# Patient Record
Sex: Female | Born: 1960
Health system: Southern US, Community
[De-identification: ages and names within clinical notes are randomized; demographics above are authoritative.]

## PROBLEM LIST (undated history)

## (undated) DIAGNOSIS — N200 Calculus of kidney: Secondary | ICD-10-CM

## (undated) DIAGNOSIS — C649 Malignant neoplasm of unspecified kidney, except renal pelvis: Secondary | ICD-10-CM

## (undated) DIAGNOSIS — Z8489 Family history of other specified conditions: Secondary | ICD-10-CM

## (undated) DIAGNOSIS — N6019 Diffuse cystic mastopathy of unspecified breast: Secondary | ICD-10-CM

## (undated) DIAGNOSIS — B019 Varicella without complication: Secondary | ICD-10-CM

## (undated) DIAGNOSIS — C4490 Unspecified malignant neoplasm of skin, unspecified: Secondary | ICD-10-CM

## (undated) DIAGNOSIS — T7840XA Allergy, unspecified, initial encounter: Secondary | ICD-10-CM

## (undated) DIAGNOSIS — Z87442 Personal history of urinary calculi: Secondary | ICD-10-CM

## (undated) DIAGNOSIS — F32A Depression, unspecified: Secondary | ICD-10-CM

## (undated) HISTORY — PX: TONSILLECTOMY AND ADENOIDECTOMY: SHX28

## (undated) HISTORY — DX: Unspecified malignant neoplasm of skin, unspecified: C44.90

## (undated) HISTORY — DX: Depression, unspecified: F32.A

## (undated) HISTORY — PX: MOHS SURGERY: SUR867

## (undated) HISTORY — DX: Allergy, unspecified, initial encounter: T78.40XA

## (undated) HISTORY — DX: Malignant neoplasm of unspecified kidney, except renal pelvis: C64.9

## (undated) HISTORY — DX: Diffuse cystic mastopathy of unspecified breast: N60.19

## (undated) HISTORY — DX: Varicella without complication: B01.9

## (undated) HISTORY — DX: Calculus of kidney: N20.0

---
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
## 2008-02-06 HISTORY — PX: MOHS SURGERY: SUR867

## 2011-08-08 LAB — HM PAP SMEAR: HM Pap smear: NEGATIVE

## 2013-02-05 HISTORY — PX: COLONOSCOPY: SHX174

## 2013-11-21 LAB — HM PAP SMEAR: HM Pap smear: NEGATIVE

## 2016-02-01 LAB — CBC AND DIFFERENTIAL
HCT: 41 (ref 36–46)
Hemoglobin: 14.3 (ref 12.0–16.0)
Platelets: 290 (ref 150–399)
WBC: 11.3

## 2016-02-01 LAB — BASIC METABOLIC PANEL
BUN: 14 (ref 4–21)
CO2: 26 — AB (ref 13–22)
Chloride: 104 (ref 99–108)
Creatinine: 0.7 (ref 0.5–1.1)
Potassium: 4.4 (ref 3.4–5.3)
Sodium: 140 (ref 137–147)

## 2016-02-01 LAB — HEPATIC FUNCTION PANEL
ALT: 24 (ref 7–35)
AST: 22 (ref 13–35)
Bilirubin, Total: 0.5

## 2016-02-01 LAB — HEMOGLOBIN A1C: Hemoglobin A1C: 6.1

## 2016-02-01 LAB — COMPREHENSIVE METABOLIC PANEL
Albumin: 4.5 (ref 3.5–5.0)
GFR calc Af Amer: 103
GFR calc non Af Amer: 89

## 2016-02-01 LAB — LIPID PANEL
Cholesterol: 221 — AB (ref 0–200)
HDL: 165 — AB (ref 35–70)
LDL Cholesterol: 127
LDl/HDL Ratio: 3.9
Triglycerides: 249 — AB (ref 40–160)

## 2016-02-01 LAB — HM HEPATITIS C SCREENING LAB: HM Hepatitis Screen: NEGATIVE

## 2016-02-01 LAB — CBC: RBC: 4.7 (ref 3.87–5.11)

## 2016-02-01 LAB — TSH: TSH: 1.24 (ref 0.41–5.90)

## 2016-05-04 LAB — HM MAMMOGRAPHY

## 2019-08-19 ENCOUNTER — Encounter: Payer: Self-pay | Admitting: Family Medicine

## 2019-08-20 ENCOUNTER — Encounter: Payer: Self-pay | Admitting: Family Medicine

## 2019-08-25 ENCOUNTER — Ambulatory Visit (INDEPENDENT_AMBULATORY_CARE_PROVIDER_SITE_OTHER): Payer: Commercial Managed Care - PPO | Admitting: Family Medicine

## 2019-08-25 ENCOUNTER — Other Ambulatory Visit: Payer: Self-pay

## 2019-08-25 VITALS — BP 109/71 | HR 74 | Temp 97.5°F | Ht 65.0 in | Wt 184.0 lb

## 2019-08-25 DIAGNOSIS — R7303 Prediabetes: Secondary | ICD-10-CM | POA: Insufficient documentation

## 2019-08-25 DIAGNOSIS — E669 Obesity, unspecified: Secondary | ICD-10-CM | POA: Diagnosis not present

## 2019-08-25 DIAGNOSIS — Z7689 Persons encountering health services in other specified circumstances: Secondary | ICD-10-CM

## 2019-08-25 DIAGNOSIS — Z23 Encounter for immunization: Secondary | ICD-10-CM

## 2019-08-25 DIAGNOSIS — Z1231 Encounter for screening mammogram for malignant neoplasm of breast: Secondary | ICD-10-CM

## 2019-08-25 DIAGNOSIS — Z130 Encounter for screening for diseases of the blood and blood-forming organs and certain disorders involving the immune mechanism: Secondary | ICD-10-CM | POA: Diagnosis not present

## 2019-08-25 DIAGNOSIS — Z0000 Encounter for general adult medical examination without abnormal findings: Secondary | ICD-10-CM | POA: Insufficient documentation

## 2019-08-25 DIAGNOSIS — Z85828 Personal history of other malignant neoplasm of skin: Secondary | ICD-10-CM | POA: Diagnosis not present

## 2019-08-25 DIAGNOSIS — D72829 Elevated white blood cell count, unspecified: Secondary | ICD-10-CM

## 2019-08-25 DIAGNOSIS — Z8481 Family history of carrier of genetic disease: Secondary | ICD-10-CM

## 2019-08-25 LAB — CBC WITH DIFFERENTIAL/PLATELET
Basophils Absolute: 0.1 10*3/uL (ref 0.0–0.1)
Basophils Relative: 0.6 % (ref 0.0–3.0)
Eosinophils Absolute: 0.2 10*3/uL (ref 0.0–0.7)
Eosinophils Relative: 2.1 % (ref 0.0–5.0)
HCT: 41.2 % (ref 36.0–46.0)
Hemoglobin: 14.2 g/dL (ref 12.0–15.0)
Lymphocytes Relative: 44.9 % (ref 12.0–46.0)
Lymphs Abs: 4.8 10*3/uL — ABNORMAL HIGH (ref 0.7–4.0)
MCHC: 34.4 g/dL (ref 30.0–36.0)
MCV: 88.1 fl (ref 78.0–100.0)
Monocytes Absolute: 0.5 10*3/uL (ref 0.1–1.0)
Monocytes Relative: 5 % (ref 3.0–12.0)
Neutro Abs: 5.1 10*3/uL (ref 1.4–7.7)
Neutrophils Relative %: 47.4 % (ref 43.0–77.0)
Platelets: 280 10*3/uL (ref 150.0–400.0)
RBC: 4.68 Mil/uL (ref 3.87–5.11)
RDW: 13.8 % (ref 11.5–15.5)
WBC: 10.7 10*3/uL — ABNORMAL HIGH (ref 4.0–10.5)

## 2019-08-25 LAB — LIPID PANEL
Cholesterol: 194 mg/dL (ref 0–200)
HDL: 57.7 mg/dL (ref >39.00)
LDL Cholesterol: 105 mg/dL — ABNORMAL HIGH (ref 0–99)
NonHDL: 136.31
Total CHOL/HDL Ratio: 3
Triglycerides: 159 mg/dL — ABNORMAL HIGH (ref 0.0–149.0)
VLDL: 31.8 mg/dL (ref 0.0–40.0)

## 2019-08-25 LAB — COMPREHENSIVE METABOLIC PANEL
ALT: 21 U/L (ref 0–35)
AST: 21 U/L (ref 0–37)
Albumin: 4.4 g/dL (ref 3.5–5.2)
Alkaline Phosphatase: 70 U/L (ref 39–117)
BUN: 20 mg/dL (ref 6–23)
CO2: 27 mEq/L (ref 19–32)
Calcium: 9.6 mg/dL (ref 8.4–10.5)
Chloride: 103 mEq/L (ref 96–112)
Creatinine, Ser: 0.75 mg/dL (ref 0.40–1.20)
GFR: 79.78 mL/min (ref >60.00)
Glucose, Bld: 100 mg/dL — ABNORMAL HIGH (ref 70–99)
Potassium: 4.2 mEq/L (ref 3.5–5.1)
Sodium: 139 mEq/L (ref 135–145)
Total Bilirubin: 0.6 mg/dL (ref 0.2–1.2)
Total Protein: 7 g/dL (ref 6.0–8.3)

## 2019-08-25 LAB — HEMOGLOBIN A1C: Hgb A1c MFr Bld: 6.2 % (ref 4.6–6.5)

## 2019-08-25 LAB — TSH: TSH: 1.21 u[IU]/mL (ref 0.35–4.50)

## 2019-08-25 NOTE — Progress Notes (Signed)
Patient ID: Angela Nielsen, female  DOB: 1960-05-28, 59 y.o.   MRN: [id number] Patient Care Team    Relationship Specialty Notifications Start End  Ma Hillock, DO PCP - General Family Medicine  08/25/19     Chief Complaint  Patient presents with  . Establish Care    fasting for labs   . Referral    to dermatology   . Back Pain    lower right side has "ache from time to time"     Subjective:  Angela Nielsen is a 59 y.o.  female present for new patient establishment. All past medical history, surgical history, allergies, family history, immunizations, medications and social history were updated in the electronic medical record today. All recent labs, ED visits and hospitalizations within the last year were reviewed.  Health maintenance:  [place]: [place] 2015, [place]. follow up 10 yrs.  Mammogram: completed:2018> ordered today. Breast cancer in Mother (Mother BRCA +).  Cervical cancer screening: last pap: 2015 or 2017, results: always normal. Immunizations: tdap UTD 11/2013, Influenza UTD (encouraged yearly), PNA series 2016, shingrix  #1 provided today (nurse visit 3 mos for #2), COVID series completed May 2021 Infectious disease screening: HIV completed 1995, Hep C completed.  DEXA: routine screen at 62 Assistive device: none Oxygen IEP:PIRJ Patient has a Dental home. Hospitalizations/ED visits:reviewed  Depression screen [facility] 2/9 08/25/2019  Decreased Interest 0  Down, Depressed, Hopeless 0  PHQ - 2 Score 0  Altered sleeping 1  Tired, decreased energy 0  Change in appetite 0  Feeling bad or failure about yourself  0  Trouble concentrating 0  Moving slowly or fidgety/restless 0  Suicidal thoughts 0  PHQ-9 Score 1  Difficult doing work/chores Not difficult at all   No flowsheet data found.     Fall Risk  08/25/2019  Falls in the past year? 0  Number falls in past yr: 0  Injury with Fall? 0   Immunization History  Administered Date(s) Administered  .  Influenza, Seasonal, Injecte, Preservative Fre 11/05/2017  . PFIZER SARS-COV-2 Vaccination 05/07/2019, 06/08/2019  . Pneumococcal Conjugate-13 02/05/2014  . Tdap 11/20/2013    No exam data present  Past Medical History:  Diagnosis Date  . Allergies   . Chicken pox   . Depression   . Fibrocystic breast disease   . Kidney stone   . Skin cancer    Basal cell    Not on File Past Surgical History:  Procedure Laterality Date  . CESAREAN SECTION  1995  . COLONOSCOPY  2015  . MOHS SURGERY     nose and shoulder   . TONSILLECTOMY AND ADENOIDECTOMY     Family History  Problem Relation Age of Onset  . Breast cancer Mother   . Heart disease Mother   . Stroke Mother   . Gout Father   . COPD Father   . Hyperlipidemia Father   . Hypertension Brother   . Hyperlipidemia Brother   . Hypertension Brother    Social History   Social History Narrative  . Not on file    Allergies as of 08/25/2019   Not on File     Medication List       Accurate as of August 25, 2019 10:48 AM. If you have any questions, ask your nurse or doctor.        fluorouracil 5 % cream Commonly known as: EFUDEX Apply topically 2 (two) times daily.   hydrocortisone 2.5 % cream Apply topically 2 (  two) times daily.   OVER THE COUNTER MEDICATION Tespo multi vitamin daily       All past medical history, surgical history, allergies, family history, immunizations andmedications were updated in the EMR today and reviewed under the history and medication portions of their EMR.    No results found for this or any previous visit (from the past 2160 hour(s)).  Patient was never admitted.   ROS: 14 pt review of systems performed and negative (unless mentioned in an HPI)  Objective: BP 109/71   Pulse 74   Temp (!) 97.5 F (36.4 C) (Temporal)   Ht '5\' 5"'  (1.651 m)   Wt 184 lb (83.5 kg)   LMP 08/24/2013   SpO2 98%   BMI 30.62 kg/m  Gen: Afebrile. No acute distress. Nontoxic in appearance,  well-developed, well-nourished, very pleasant, obese Caucasian female. HENT: AT. [place]. Bilateral TM visualized and normal in appearance, normal external auditory canal. MMM, no oral lesions, adequate dentition. Bilateral nares within normal limits. Throat without erythema, ulcerations or exudates. no Cough on exam, no hoarseness on exam. Eyes:Pupils Equal Round Reactive to light, Extraocular movements intact,  Conjunctiva without redness, discharge or icterus. Neck/lymp/endocrine: Supple,no lymphadenopathy, no thyromegaly CV: RRR no murmur, no edema, +2/4 P posterior tibialis pulses.  Chest: CTAB, no wheeze, rhonchi or crackles. normal Respiratory effort. good Air movement. Abd: Soft. obese. NTND. BS present. no Masses palpated. No hepatosplenomegaly. No rebound tenderness or guarding. Skin: no rashes, purpura or petechiae. Warm and well-perfused. Skin intact. Neuro/Msk:  Normal gait. PERLA. EOMi. Alert. Oriented x3.  Cranial nerves II through XII intact. Muscle strength 5/5 upper/lower extremity. DTRs equal bilaterally. Psych: Normal affect, dress and demeanor. Normal speech. Normal thought content and judgment.   Assessment/plan: Angela Nielsen is a 59 y.o. female present for new pt est History of basal cell cancer - Ambulatory referral to Dermatology Breast cancer screening by mammogram/Family history of BRCA gene mutation - MM DIGITAL SCREENING BILATERAL; Future Prediabetes Patient had been on Metformin in the past-  has been able to control with diet. - Comprehensive metabolic panel - Hemoglobin A1c - TSH Obesity (BMI 30-39.9 -Pain exercise and dietary modifications encouraged - Lipid panel Screening for deficiency anemia - CBC with Differential/Platelet  Leukocytosis, unspecified type Chronic lymphocytosis.  Followed with oncology at one time and was told it is her baseline and normal.  Encounter for preventive health examination Patient was encouraged to exercise greater than 150  minutes a week. Patient was encouraged to choose a diet filled with fresh fruits and vegetables, and lean meats. AVS provided to patient today for education/recommendation on gender specific health and safety maintenance. [place]: [place] follow up 10 yrs.  Mammogram: completed:2018> ordered today. Breast cancer in Mother (Mother BRCA +).  Cervical cancer screening: last pap: 2015 or 2017, results: always normal. Immunizations: tdap UTD 11/2013, Influenza UTD (encouraged yearly), PNA series 2016, shingrix  #1 provided today (nurse visit 3 mos for #2), COVID series completed May 2021 Infectious disease screening: HIV completed 1995, Hep C completed.  DEXA: routine screen at 62  Return in about 1 year (around 08/24/2020) for CPE (30 min); 3 mos nurse visit for shingrix. . Or can have second Shingrix completed with Pap/cervical cancer screening appointment if scheduled in less than 6 months.  Orders Placed This Encounter  Procedures  . MM DIGITAL SCREENING BILATERAL  . CBC with Differential/Platelet  . Comprehensive metabolic panel  . Lipid panel  . Hemoglobin A1c  . TSH  .  Ambulatory referral to Dermatology   No orders of the defined types were placed in this encounter.   Referral Orders     Ambulatory referral to Dermatology   Note is dictated utilizing voice recognition software. Although note has been proof read prior to signing, occasional typographical errors still can be missed. If any questions arise, please do not hesitate to call for verification.  Electronically signed by: Howard Pouch, DO [place]

## 2019-08-25 NOTE — Patient Instructions (Addendum)
Follow up in 1 year for physical. Sooner if needed or kidney stone concerns.  We will call you with lab results.  Mammogram will call  You to schedule.  Dermatology will call to get you established.   Health Maintenance, Female Adopting a healthy lifestyle and getting preventive care are important in promoting health and wellness. Ask your health care provider about:  The right schedule for you to have regular tests and exams.  Things you can do on your own to prevent diseases and keep yourself healthy. What should I know about diet, weight, and exercise? Eat a healthy diet   Eat a diet that includes plenty of vegetables, fruits, low-fat dairy products, and lean protein.  Do not eat a lot of foods that are high in solid fats, added sugars, or sodium. Maintain a healthy weight Body mass index (BMI) is used to identify weight problems. It estimates body fat based on height and weight. Your health care provider can help determine your BMI and help you achieve or maintain a healthy weight. Get regular exercise Get regular exercise. This is one of the most important things you can do for your health. Most adults should:  Exercise for at least 150 minutes each week. The exercise should increase your heart rate and make you sweat (moderate-intensity exercise).  Do strengthening exercises at least twice a week. This is in addition to the moderate-intensity exercise.  Spend less time sitting. Even light physical activity can be beneficial. Watch cholesterol and blood lipids Have your blood tested for lipids and cholesterol at 57 years of age, then have this test every 5 years. Have your cholesterol levels checked more often if:  Your lipid or cholesterol levels are high.  You are older than 57 years of age.  You are at high risk for heart disease. What should I know about cancer screening? Depending on your health history and family history, you may need to have cancer screening at  various ages. This may include screening for:  Breast cancer.  Cervical cancer.  Colorectal cancer.  Skin cancer.  Lung cancer. What should I know about heart disease, diabetes, and high blood pressure? Blood pressure and heart disease  High blood pressure causes heart disease and increases the risk of stroke. This is more likely to develop in people who have high blood pressure readings, are of African descent, or are overweight.  Have your blood pressure checked: ? Every 3-5 years if you are 40-14 years of age. ? Every year if you are 23 years old or older. Diabetes Have regular diabetes screenings. This checks your fasting blood sugar level. Have the screening done:  Once every three years after age 73 if you are at a normal weight and have a low risk for diabetes.  More often and at a younger age if you are overweight or have a high risk for diabetes. What should I know about preventing infection? Hepatitis B If you have a higher risk for hepatitis B, you should be screened for this virus. Talk with your health care provider to find out if you are at risk for hepatitis B infection. Hepatitis C Testing is recommended for:  Everyone born from [birth year] through 1963.  Anyone with known risk factors for hepatitis C. Sexually transmitted infections (STIs)  Get screened for STIs, including gonorrhea and chlamydia, if: ? You are sexually active and are younger than 57 years of age. ? You are older than 57 years of age and your health care  provider tells you that you are at risk for this type of infection. ? Your sexual activity has changed since you were last screened, and you are at increased risk for chlamydia or gonorrhea. Ask your health care provider if you are at risk.  Ask your health care provider about whether you are at high risk for HIV. Your health care provider may recommend a prescription medicine to help prevent HIV infection. If you choose to take medicine to prevent  HIV, you should first get tested for HIV. You should then be tested every 3 months for as long as you are taking the medicine. Pregnancy  If you are about to stop having your period (premenopausal) and you may become pregnant, seek counseling before you get pregnant.  Take 400 to 800 micrograms (mcg) of folic acid every day if you become pregnant.  Ask for birth control (contraception) if you want to prevent pregnancy. Osteoporosis and menopause Osteoporosis is a disease in which the bones lose minerals and strength with aging. This can result in bone fractures. If you are 76 years old or older, or if you are at risk for osteoporosis and fractures, ask your health care provider if you should:  Be screened for bone loss.  Take a calcium or vitamin D supplement to lower your risk of fractures.  Be given hormone replacement therapy (HRT) to treat symptoms of menopause. Follow these instructions at home: Lifestyle  Do not use any products that contain nicotine or tobacco, such as cigarettes, e-cigarettes, and chewing tobacco. If you need help quitting, ask your health care provider.  Do not use street drugs.  Do not share needles.  Ask your health care provider for help if you need support or information about quitting drugs. Alcohol use  Do not drink alcohol if: ? Your health care provider tells you not to drink. ? You are pregnant, may be pregnant, or are planning to become pregnant.  If you drink alcohol: ? Limit how much you use to 0-1 drink a day. ? Limit intake if you are breastfeeding.  Be aware of how much alcohol is in your drink. In the [place] one drink equals one 12 oz bottle of beer (355 mL), one 5 oz glass of wine (148 mL), or one 1 oz glass of hard liquor (44 mL). General instructions  Schedule regular health, dental, and eye exams.  Stay current with your vaccines.  Tell your health care provider if: ? You often feel depressed. ? You have ever been abused or do  not feel safe at home. Summary  Adopting a healthy lifestyle and getting preventive care are important in promoting health and wellness.  Follow your health care provider's instructions about healthy diet, exercising, and getting tested or screened for diseases.  Follow your health care provider's instructions on monitoring your cholesterol and blood pressure. This information is not intended to replace advice given to you by your health care provider. Make sure you discuss any questions you have with your health care provider. Document Revised: 01/15/2018 Document Reviewed: 01/15/2018 Elsevier Patient Education  [place].   Please help [place] help you:  We are honored you have chosen [facility] for your Primary Care home. Below you will find basic instructions that you may need to access in the future. Please help [place] help you by reading the instructions, which cover many of the frequent questions we experience.   Prescription refills and request:  -In order to allow more efficient response  time, please call your pharmacy for all refills. They will forward the request electronically to [place]. This allows for the quickest possible response. Request left on a nurse line can take longer to refill, since these are checked as time allows between office patients and other phone calls.  - refill request can take up to 3-5 working days to complete.  - If request is sent electronically and request is appropiate, it is usually completed in 1-2 business days.  - all patients will need to be seen routinely for all chronic medical conditions requiring prescription medications (see follow-up below). If you are overdue for follow up on your condition, you will be asked to make an appointment and we will call in enough medication to cover you until your appointment (up to 30 days).  - all controlled substances will require a face to face visit to request/refill.  - if you desire your prescriptions to  go through a new pharmacy, and have an active script at original pharmacy, you will need to call your pharmacy and have scripts transferred to new pharmacy. This is completed between the pharmacy locations and not by your provider.    Results: Our office handles many outgoing and incoming calls daily. If we have not contacted you within 1 week about your results, please check your mychart to see if there is a message first and if not, then contact our office.  In helping with this matter, you help decrease call volume, and therefore allow [place] to be able to respond to patients needs more efficiently.  We will always attempt to call you with results,  normal or abnormal. However, if we are unable to reach you we will send a message in your my chart with results.   Acute office visits (sick visit):  An acute visit is intended for a new problem and are scheduled in shorter time slots to allow schedule openings for patients with new problems. This is the appropriate visit to discuss a new problem. Problems will not be addressed by phone call or Echart message. Appointment is needed if requesting treatment. In order to provide you with excellent quality medical care with proper time for you to explain your problem, have an exam and receive treatment with instructions, these appointments should be limited to one new problem per visit. If you experience a new problem, in which you desire to be addressed, please make an acute office visit, we save openings on the schedule to accommodate you. Please do not save your new problem for any other type of visit, let us take care of it properly and quickly for you.   Follow up visits:  Depending on your condition(s) your provider will need to see you routinely in order to provide you with quality care and prescribe medication(s). Most chronic conditions (Example: hypertension, Diabetes, depression/anxiety... etc), require visits a couple times a year. Your provider will  instruct you on proper follow up for your personal medical conditions and history. Please make certain to make follow up appointments for your condition as instructed. Failing to do so could result in lapse in your medication treatment/refills. If you request a refill, and are overdue to be seen on a condition, we will always provide you with a 30 day script (once) to allow you time to schedule.    Medicare wellness (well visit): - we have a wonderful Nurse Maudie Mercury), that will meet with you and provide you will yearly medicare wellness visits. These visits should occur yearly (can  not be scheduled less than 1 calendar year apart) and cover preventive health, immunizations, advance directives and screenings you are entitled to yearly through your medicare benefits. Do not miss out on your entitled benefits, this is when medicare will pay for these benefits to be ordered for you.  These are strongly encouraged by your provider and is the appropriate type of visit to make certain you are up to date with all preventive health benefits. If you have not had your medicare wellness exam in the last 12 months, please make certain to schedule one by calling the office and schedule your medicare wellness with Maudie Mercury as soon as possible.   Yearly physical (well visit):  - Adults are recommended to be seen yearly for physicals. Check with your insurance and date of your last physical, most insurances require one calendar year between physicals. Physicals include all preventive health topics, screenings, medical exam and labs that are appropriate for gender/age and history. You may have fasting labs needed at this visit. This is a well visit (not a sick visit), new problems should not be covered during this visit (see acute visit).  - Pediatric patients are seen more frequently when they are younger. Your provider will advise you on well child visit timing that is appropriate for your their age. - This is not a medicare  wellness visit. Medicare wellness exams do not have an exam portion to the visit. Some medicare companies allow for a physical, some do not allow a yearly physical. If your medicare allows a yearly physical you can schedule the medicare wellness with our nurse Maudie Mercury and have your physical with your provider after, on the same day. Please check with insurance for your full benefits.   Late Policy/No Shows:  - all new patients should arrive 15-30 minutes earlier than appointment to allow [place] time  to  obtain all personal demographics,  insurance information and for you to complete office paperwork. - All established patients should arrive 10-15 minutes earlier than appointment time to update all information and be checked in .  - In our best efforts to run on time, if you are late for your appointment you will be asked to either reschedule or if able, we will work you back into the schedule. There will be a wait time to work you back in the schedule,  depending on availability.  - If you are unable to make it to your appointment as scheduled, please call 24 hours ahead of time to allow [place] to fill the time slot with someone else who needs to be seen. If you do not cancel your appointment ahead of time, you may be charged a no show fee.  Your BMI Body mass index is 30.62 kg/m.  BMI for Adults What is BMI? Body mass index (BMI) is a number that is calculated from a person's weight and height. BMI can help estimate how much of a person's weight is composed of fat. BMI does not measure body fat directly. Rather, it is an alternative to procedures that directly measure body fat, which can be difficult and expensive. BMI can help identify people who may be at higher risk for certain medical problems. What are BMI measurements used for? BMI is used as a screening tool to identify possible weight problems. It helps determine whether a person is obese, overweight, a healthy weight, or underweight. BMI is useful  for:  Identifying a weight problem that may be related to a medical condition or may  increase the risk for medical problems.  Promoting changes, such as changes in diet and exercise, to help reach a healthy weight. BMI screening can be repeated to see if these changes are working. How is BMI calculated? BMI involves measuring your weight in relation to your height. Both height and weight are measured, and the BMI is calculated from those numbers. This can be done either in [place] ([place] or metric measurements. Note that charts and online BMI calculators are available to help you find your BMI quickly and easily without having to do these calculations yourself. To calculate your BMI in [place] ([place] measurements:  1. Measure your weight in pounds (lb). 2. Multiply the number of pounds by 703. ? For example, for a person who weighs 180 lb, multiply that number by 703, which equals 126,540. 3. Measure your height in inches. Then multiply that number by itself to get a measurement called "inches squared." ? For example, for a person who is 70 inches tall, the "inches squared" measurement is 70 inches x 70 inches, which equals 4,900 inches squared. 4. Divide the total from step 2 (number of lb x 703) by the total from step 3 (inches squared): 126,540  4,900 = 25.8. This is your BMI. To calculate your BMI in metric measurements: 1. Measure your weight in kilograms (kg). 2. Measure your height in meters (m). Then multiply that number by itself to get a measurement called "meters squared." ? For example, for a person who is 1.75 m tall, the "meters squared" measurement is 1.75 m x 1.75 m, which is equal to 3.1 meters squared. 3. Divide the number of kilograms (your weight) by the meters squared number. In this example: 70  3.1 = 22.6. This is your BMI. What do the results mean? BMI charts are used to identify whether you are underweight, normal weight, overweight, or obese. The following guidelines  will be used:  Underweight: BMI less than 18.5.  Normal weight: BMI between 18.5 and 24.9.  Overweight: BMI between 25 and 29.9.  Obese: BMI of 30 or above. Keep these notes in mind:  Weight includes both fat and muscle, so someone with a muscular build, such as an athlete, may have a BMI that is higher than 24.9. In cases like these, BMI is not an accurate measure of body fat.  To determine if excess body fat is the cause of a BMI of 25 or higher, further assessments may need to be done by a health care provider.  BMI is usually interpreted in the same way for men and women. Where to find more information For more information about BMI, including tools to quickly calculate your BMI, go to these websites:  Centers for Disease Control and Prevention: http://www.wolf.info/  American Heart Association: www.heart.org  National Heart, Lung, and Blood Institute: https://wilson-eaton.com/ Summary  Body mass index (BMI) is a number that is calculated from a person's weight and height.  BMI may help estimate how much of a person's weight is composed of fat. BMI can help identify those who may be at higher risk for certain medical problems.  BMI can be measured using English measurements or metric measurements.  BMI charts are used to identify whether you are underweight, normal weight, overweight, or obese. This information is not intended to replace advice given to you by your health care provider. Make sure you discuss any questions you have with your health care provider. Document Revised: 10/15/2018 Document Reviewed: 08/22/2018 Elsevier Patient Education  2020 Elsevier  Inc.  

## 2019-08-26 ENCOUNTER — Telehealth: Payer: Self-pay

## 2019-08-26 NOTE — Telephone Encounter (Signed)
New patient to Dr. Raoul Pitch yesterday. Signed up on mychart   She received her Shringrix vaccine yesterday but it is not showing up in her immunizations.  Please advise. Patient can be called at [phone]

## 2019-08-27 ENCOUNTER — Encounter: Payer: Self-pay | Admitting: Family Medicine

## 2019-08-27 DIAGNOSIS — N200 Calculus of kidney: Secondary | ICD-10-CM | POA: Insufficient documentation

## 2019-08-27 DIAGNOSIS — D72829 Elevated white blood cell count, unspecified: Secondary | ICD-10-CM | POA: Insufficient documentation

## 2019-08-27 DIAGNOSIS — Z8481 Family history of carrier of genetic disease: Secondary | ICD-10-CM | POA: Insufficient documentation

## 2019-09-08 ENCOUNTER — Other Ambulatory Visit: Payer: Self-pay | Admitting: Family Medicine

## 2019-09-08 DIAGNOSIS — Z1231 Encounter for screening mammogram for malignant neoplasm of breast: Secondary | ICD-10-CM

## 2019-09-09 ENCOUNTER — Ambulatory Visit (INDEPENDENT_AMBULATORY_CARE_PROVIDER_SITE_OTHER): Payer: Commercial Managed Care - PPO

## 2019-09-09 ENCOUNTER — Other Ambulatory Visit: Payer: Self-pay

## 2019-09-09 DIAGNOSIS — Z1231 Encounter for screening mammogram for malignant neoplasm of breast: Secondary | ICD-10-CM | POA: Diagnosis not present

## 2019-11-25 ENCOUNTER — Other Ambulatory Visit: Payer: Self-pay

## 2019-11-25 ENCOUNTER — Encounter: Payer: Self-pay | Admitting: Family Medicine

## 2019-11-25 ENCOUNTER — Ambulatory Visit (INDEPENDENT_AMBULATORY_CARE_PROVIDER_SITE_OTHER): Payer: Commercial Managed Care - PPO

## 2019-11-25 DIAGNOSIS — Z23 Encounter for immunization: Secondary | ICD-10-CM | POA: Diagnosis not present

## 2020-01-18 ENCOUNTER — Other Ambulatory Visit: Payer: Self-pay

## 2020-01-19 ENCOUNTER — Other Ambulatory Visit (HOSPITAL_COMMUNITY)
Admission: RE | Admit: 2020-01-19 | Discharge: 2020-01-19 | Disposition: A | Discharge status: Home / Self Care | Payer: Commercial Managed Care - PPO | Source: Ambulatory Visit | Attending: Family Medicine | Admitting: Family Medicine

## 2020-01-19 ENCOUNTER — Encounter: Payer: Self-pay | Admitting: Family Medicine

## 2020-01-19 ENCOUNTER — Ambulatory Visit (INDEPENDENT_AMBULATORY_CARE_PROVIDER_SITE_OTHER): Payer: Commercial Managed Care - PPO | Admitting: Family Medicine

## 2020-01-19 VITALS — BP 124/79 | HR 77 | Temp 98.1°F | Ht 65.0 in | Wt 198.0 lb

## 2020-01-19 DIAGNOSIS — Z01419 Encounter for gynecological examination (general) (routine) without abnormal findings: Secondary | ICD-10-CM | POA: Diagnosis not present

## 2020-01-19 DIAGNOSIS — R7303 Prediabetes: Secondary | ICD-10-CM

## 2020-01-19 DIAGNOSIS — E669 Obesity, unspecified: Secondary | ICD-10-CM | POA: Diagnosis not present

## 2020-01-19 LAB — HEMOGLOBIN A1C: Hgb A1c MFr Bld: 6.5 % (ref 4.6–6.5)

## 2020-01-19 NOTE — Patient Instructions (Signed)
    We will call you lab results once received, pap test could take up to 1 week.

## 2020-01-19 NOTE — Progress Notes (Signed)
Patient ID: Angela Nielsen, female  DOB: 1960/04/21, 59 y.o.   MRN: [id number] Patient Care Team    Relationship Specialty Notifications Start End  Ma Hillock, DO PCP - General Family Medicine  08/25/19     Chief Complaint  Patient presents with  . Follow-up    CMC; pt is fasting  . Gynecologic Exam    Subjective: Angela Nielsen is a 59 y.o.  female present for [facility] and PAP Prediabetes: pt has been on metformin in the past. Last a1c 6.2 in July 2021. She reports she has not been very good on her diet lately.   Pt reports Patient's last menstrual period was 08/24/2013. She denies vaginal discharge, lesions, bleeding or dysparunia. Last PAP 2015 - normal.    Depression screen [facility] 2/9 01/19/2020 08/25/2019  Decreased Interest 0 0  Down, Depressed, Hopeless 0 0  PHQ - 2 Score 0 0  Altered sleeping - 1  Tired, decreased energy - 0  Change in appetite - 0  Feeling bad or failure about yourself  - 0  Trouble concentrating - 0  Moving slowly or fidgety/restless - 0  Suicidal thoughts - 0  PHQ-9 Score - 1  Difficult doing work/chores - Not difficult at all   No flowsheet data found.     Fall Risk  08/25/2019  Falls in the past year? 0  Number falls in past yr: 0  Injury with Fall? 0   Immunization History  Administered Date(s) Administered  . Influenza, Seasonal, Injecte, Preservative Fre 11/05/2017  . Influenza,inj,Quad PF,6+ Mos 11/25/2019  . PFIZER SARS-COV-2 Vaccination 05/07/2019, 06/08/2019  . Pneumococcal Conjugate-13 02/05/2014  . Tdap 11/20/2013  . Zoster Recombinat (Shingrix) 08/25/2019, 11/25/2019    No exam data present  Past Medical History:  Diagnosis Date  . Allergies   . Chicken pox   . Depression   . Fibrocystic breast disease   . Kidney stone   . Skin cancer    Basal cell    No Known Allergies Past Surgical History:  Procedure Laterality Date  . CESAREAN SECTION  1995  . COLONOSCOPY  2015  . MOHS SURGERY     nose and shoulder   .  TONSILLECTOMY AND ADENOIDECTOMY  1969   Family History  Problem Relation Age of Onset  . Breast cancer Mother        BRCA +  . Heart disease Mother   . Stroke Mother   . Gout Father   . COPD Father   . Hyperlipidemia Father   . Hypertension Brother   . Hyperlipidemia Brother   . Hypertension Brother    Social History   Social History Narrative  . Not on file    Allergies as of 01/19/2020   No Known Allergies     Medication List       Accurate as of January 19, 2020 10:25 AM. If you have any questions, ask your nurse or doctor.        STOP taking these medications   hydrocortisone 2.5 % cream Stopped by: Howard Pouch, DO     TAKE these medications   fluorouracil 5 % cream Commonly known as: EFUDEX Apply topically 2 (two) times daily.   OVER THE COUNTER MEDICATION Tespo multi vitamin daily       All past medical history, surgical history, allergies, family history, immunizations andmedications were updated in the EMR today and reviewed under the history and medication portions of their EMR.    No results  found for this or any previous visit (from the past 2160 hour(s)).  Patient was never admitted.   ROS: 14 pt review of systems performed and negative (unless mentioned in an HPI)  Objective: BP 124/79   Pulse 77   Temp 98.1 F (36.7 C) (Oral)   Ht '5\' 5"'  (1.651 m)   Wt 198 lb (89.8 kg)   LMP 08/24/2013   SpO2 96%   BMI 32.95 kg/m  Gen: Afebrile. No acute distress.  HENT: AT. [place].  Eyes:Pupils Equal Round Reactive to light, Extraocular movements intact,  Conjunctiva without redness, discharge or icterus. Neck/lymp/endocrine: Supple,no lymphadenopathy CV: RRR no murmur, noedema Chest: CTAB, no wheeze or crackles Abd: Soft. NTND. BS present. Skin: no rashes, purpura or petechiae.  Neuro:  Normal gait. PERLA. EOMi. Alert. Orientedx3 Psych: Normal affect, dress and demeanor. Normal speech. Normal thought content and judgment. Breasts: breasts  appear normal, symmetrical, no tenderness on exam, no suspicious masses, no skin or nipple changes or axillary nodes. GYN:  External genitalia within normal limits, normal hair distribution, no lesions. Urethral meatus normal, no lesions. Vaginal mucosa pink, moist, normal rugae, no lesions. No cystocele or rectocele. cervix without lesions-mildly stenotic, mild wht discharge. Bimanual exam revealed normal uterus.  No bladder/suprapubic fullness, masses or tenderness. No cervical motion tenderness. No adnexal fullness. Anus and perineum within normal limits, no lesions.   Assessment/plan: Angela Nielsen is a 59 y.o. female present for  Prediabetes/obesity Patient had been on Metformin in the past-  has been able to control with diet. - HgB A1C- pt declined POCT and desired blood draw.  If > 6.5 would restart metformin. Otherwise, continue to work on diet and exercise.  Encounter for routine gynecological examination with Papanicolaou smear of cervix - Cytology - PAP( [facility]) w/ co-test.  If normal rpt 5 yrs.    Return in about 7 months (around 08/25/2020) for CPE (30 min).   Orders Placed This Encounter  Procedures  . Hemoglobin A1c   No orders of the defined types were placed in this encounter.  Referral Orders  No referral(s) requested today     Note is dictated utilizing voice recognition software. Although note has been proof read prior to signing, occasional typographical errors still can be missed. If any questions arise, please do not hesitate to call for verification.  Electronically signed by: Howard Pouch, DO [facility]

## 2020-01-20 ENCOUNTER — Telehealth: Payer: Self-pay | Admitting: Family Medicine

## 2020-01-20 ENCOUNTER — Other Ambulatory Visit: Payer: Self-pay | Admitting: Family Medicine

## 2020-01-20 MED ORDER — METFORMIN HCL 500 MG PO TABS: 500.0000 mg | ORAL_TABLET | Freq: Every day | ORAL | 1 refills | Status: DC

## 2020-01-20 NOTE — Telephone Encounter (Signed)
Error

## 2020-01-21 LAB — CYTOLOGY - PAP
Comment: NEGATIVE
Diagnosis: NEGATIVE
High risk HPV: NEGATIVE

## 2020-03-21 ENCOUNTER — Telehealth: Payer: Self-pay

## 2020-03-21 NOTE — Telephone Encounter (Signed)
Patient calling about bill she received of $44.77.  She is upset that she came in on DOS 08/25/19 for a new patient visit and Dr. Raoul Pitch did a complete physical BUT had to come back to do pap smear visit on 01/19/20.  Pap smear would have been covered on visit in July if provider had done at that time.   DOS 01/19/20 $44.77 [place]  Patient can be reached at [phone].  Patient aware follow up call will be sometime this week.

## 2020-03-28 NOTE — Telephone Encounter (Signed)
Spoke with patient late Friday afternoon, 03/25/20 regarding her bill of $44.77.  Patient expressed frustration that her pap was not done in the same visit as her new patient appointment on 08/25/19.   I explained to the patient we cannot guarantee what all the provider will do during a new patient appointment.  Patient stated she will make sure subsequent yearly visits will include all services so she will not incur a bill like this again.  She was happy for the return call and expressed gratitude for me calling her back.

## 2020-04-19 ENCOUNTER — Ambulatory Visit: Payer: Commercial Managed Care - PPO | Admitting: Family Medicine

## 2020-06-13 ENCOUNTER — Encounter: Payer: Self-pay | Admitting: Family Medicine

## 2020-06-13 DIAGNOSIS — C649 Malignant neoplasm of unspecified kidney, except renal pelvis: Secondary | ICD-10-CM | POA: Insufficient documentation

## 2020-06-13 DIAGNOSIS — K760 Fatty (change of) liver, not elsewhere classified: Secondary | ICD-10-CM | POA: Insufficient documentation

## 2020-06-13 DIAGNOSIS — N289 Disorder of kidney and ureter, unspecified: Secondary | ICD-10-CM | POA: Insufficient documentation

## 2020-06-14 ENCOUNTER — Ambulatory Visit (INDEPENDENT_AMBULATORY_CARE_PROVIDER_SITE_OTHER): Payer: Commercial Managed Care - PPO | Admitting: Family Medicine

## 2020-06-14 ENCOUNTER — Encounter: Payer: Self-pay | Admitting: Family Medicine

## 2020-06-14 ENCOUNTER — Other Ambulatory Visit: Payer: Self-pay

## 2020-06-14 VITALS — BP 107/72 | HR 74 | Temp 98.3°F | Ht 65.0 in | Wt 199.0 lb

## 2020-06-14 DIAGNOSIS — L820 Inflamed seborrheic keratosis: Secondary | ICD-10-CM

## 2020-06-14 DIAGNOSIS — L570 Actinic keratosis: Secondary | ICD-10-CM | POA: Insufficient documentation

## 2020-06-14 DIAGNOSIS — N2889 Other specified disorders of kidney and ureter: Secondary | ICD-10-CM

## 2020-06-14 DIAGNOSIS — N200 Calculus of kidney: Secondary | ICD-10-CM | POA: Diagnosis not present

## 2020-06-14 HISTORY — DX: Actinic keratosis: L57.0

## 2020-06-14 HISTORY — DX: Inflamed seborrheic keratosis: L82.0

## 2020-06-14 NOTE — Patient Instructions (Addendum)
I have ordered the CT of your kidneys with contrast at [facility].    We will call you results as soon as we get them.

## 2020-06-14 NOTE — Progress Notes (Signed)
This visit occurred during the SARS-CoV-2 public health emergency.  Safety protocols were in place, including screening questions prior to the visit, additional usage of staff PPE, and extensive cleaning of exam room while observing appropriate contact time as indicated for disinfecting solutions.    Angela Nielsen , [birth year], 60 y.o., female MRN: [id number] Patient Care Team    Relationship Specialty Notifications Start End  Ma Hillock, DO PCP - General Family Medicine  08/25/19   Eusebio Friendly, MD Referring Physician Internal Medicine  06/15/20    Comment: Gastroenterology    Chief Complaint  Patient presents with  . Hospitalization Follow-up    Pt is not fasting     Subjective: the patient  Is a 60 y.o. female presents today for ED follow-up for abdominal/flank pain in which it was found she had a tiny stone in the proximal left ureter with some mild hydronephrosis and a nonobstructing 4 mm left-sided renal stone.  There was also an incidental finding of a 4.4 cm mildly complex appearing right renal lesion and a smaller low-density lesion of the left kidney.  Patient has noticed a mild dull discomfort of her right lower thoracic area.  She denies any fever, chills, nausea or vomit.  She denies any urinary changes.  She has never been a smoker.  No family history of kidney disease or cancer.  06/07/2020 CT ABD: 1. Hepatomegaly and hepatic steatosis.  2. Tiny stone in the proximal left ureter with very mild left sided hydronephrosis.  3. Additional nonobstructing 4 mm left-sided renal stone.  4. There is a 4.4 cm mildly complex appearing right renallesion and a smaller low-density lesion of the left kidney. These may represent cysts however solid neoplasm cannot be excluded. Nonemergent renal mass protocol CT with and without contrast is recommended for additional evaluation.  5. Pancreatic steatosis.   URINALYSIS [facility] 06/07/2020 Component    Urine Color  Urine  Appearance  Urine Specific Gravity  Urine pH  Urine Protein - Dipstick  Urine Glucose  Urine Ketones  Urine Bilirubin  Urine Blood  Urine Nitrite  Urine Urobilinogen  Urine Leukocyte Esterase  Urine Squamous Epithelial Cells  Urine WBC  Urine RBC   Component 06/07/2020     Urine Color Yellow  Urine Appearance Clear  Urine Specific Gravity 1.018  Urine pH 5.0  Urine Protein - Dipstick Negative  Urine Glucose Negative  Urine Ketones Negative  Urine Bilirubin Negative  Urine Blood 1.0Abnormal    Urine Nitrite Negative  Urine Urobilinogen <2  Urine Leukocyte Esterase Negative  Urine Squamous Epithelial Cells 0-2  Urine WBC 0-2  Urine RBC 10-20Abnormal       Component 06/07/2020 01/17/2018      WBC 11.0 13.1Abnormal    RBC 4.63 --  HGB 14.0 --  HCT 41.2 --  MCV 89 --  MCH 30.2 --  MCHC 34.0 --  Plt Ct 243 --  RDW SD 40.7 --  MPV 9.0 --    Component 06/07/2020 06/07/2020      Na 139 --  Potassium 4.3 --  Cl 102 --  CO2 21 --  AGAP 16 --  BUN 18 --  Creatinine 0.74 --  Ca 9.5 --  ALK PHOS 89 --  T Bili 0.44 --  Total Protein 7.3 --  Alb 4.5 --  GLOBULIN 2.8 --  ALBUMIN/GLOBULIN RATIO 1.6 --  BUN/CREAT RATIO 24.3 --  ALT 40 --  AST 31 --  eGFR 95 --  Lipase --  12    Depression screen [facility] 2/9 01/19/2020 08/25/2019  Decreased Interest 0 0  Down, Depressed, Hopeless 0 0  PHQ - 2 Score 0 0  Altered sleeping - 1  Tired, decreased energy - 0  Change in appetite - 0  Feeling bad or failure about yourself  - 0  Trouble concentrating - 0  Moving slowly or fidgety/restless - 0  Suicidal thoughts - 0  PHQ-9 Score - 1  Difficult doing work/chores - Not difficult at all    No Known Allergies Social History   Social History Narrative  . Not on file   Past Medical History:  Diagnosis Date  . Allergies   . Chicken pox   . Depression   . Fibrocystic breast disease   . Kidney stone   . Skin cancer    Basal cell    Past Surgical  History:  Procedure Laterality Date  . CESAREAN SECTION  1995  . COLONOSCOPY  2015  . MOHS SURGERY     nose and shoulder   . TONSILLECTOMY AND ADENOIDECTOMY  1969   Family History  Problem Relation Age of Onset  . Breast cancer Mother        BRCA +  . Heart disease Mother   . Stroke Mother   . Gout Father   . COPD Father   . Hyperlipidemia Father   . Hypertension Brother   . Hyperlipidemia Brother   . Hypertension Brother    Allergies as of 06/14/2020   No Known Allergies     Medication List       Accurate as of Jun 14, 2020 11:59 PM. If you have any questions, ask your nurse or doctor.        STOP taking these medications   fluorouracil 5 % cream Commonly known as: EFUDEX Stopped by: Howard Pouch, DO   metFORMIN 500 MG tablet Commonly known as: GLUCOPHAGE Stopped by: Howard Pouch, DO     TAKE these medications   MEGAVITE FRUITS & VEGGIES PO Take by mouth.   OVER THE COUNTER MEDICATION Tespo multi vitamin daily   OVER THE COUNTER MEDICATION Cinsulin       All past medical history, surgical history, allergies, family history, immunizations andmedications were updated in the EMR today and reviewed under the history and medication portions of their EMR.     ROS: Negative, with the exception of above mentioned in HPI   Objective:  BP 107/72   Pulse 74   Temp 98.3 F (36.8 C) (Oral)   Ht '5\' 5"'  (1.651 m)   Wt 199 lb (90.3 kg)   LMP 08/24/2013   SpO2 96%   BMI 33.12 kg/m  Body mass index is 33.12 kg/m. Gen: Afebrile. No acute distress. Nontoxic in appearance, well developed, well nourished.  HENT: AT. [place].  Moist mucous membranes Eyes:Pupils Equal Round Reactive to light, Extraocular movements intact,  Conjunctiva without redness, discharge or icterus. CV: RRR no murmur, no edema Chest: CTAB, no wheeze or crackles. Good air movement, normal resp effort.  Abd: Soft. NTND. BS present.  No masses palpated. No rebound or guarding.  MSK: No CVA  tenderness Skin: No rashes, purpura or petechiae.  Neuro:  Normal gait. PERLA. EOMi. Alert. Oriented x3  Psych: Normal affect, dress and demeanor. Normal speech. Normal thought content and judgment.  No exam data present No results found. No results found for this or any previous visit (from the past 24 hour(s)).  Assessment/Plan: Angela Nielsen is a 60  y.o. female present for OV for  Other specified disorders of kidney and ureter/ Renal mass Discussed renal CT to further evaluate the kidney lesions incidentally found.  She does have some mild pressure symptoms on her right lower back that could be related to kidney mass. As far as the kidney stones, currently she is asymptomatic.  We did discuss the use of Flomax in the future if needed. He was encouraged to hydrate.  Use NSAIDs for discomfort. - CT RENAL ABD W/WO; Future - Urinalysis w microscopic + reflex cultur CMP 06/07/2020. Normal kidney function.  Follow-up as needed, will likely refer to urology once CT has returned.   Reviewed expectations re: course of current medical issues.  Discussed self-management of symptoms.  Outlined signs and symptoms indicating need for more acute intervention.  Patient verbalized understanding and all questions were answered.  Patient received an After-Visit Summary.    Orders Placed This Encounter  Procedures  . CT RENAL ABD W/WO  . Urinalysis w microscopic + reflex cultur  . REFLEXIVE URINE CULTURE   No orders of the defined types were placed in this encounter.  Referral Orders  No referral(s) requested today     Note is dictated utilizing voice recognition software. Although note has been proof read prior to signing, occasional typographical errors still can be missed. If any questions arise, please do not hesitate to call for verification.   electronically signed by:  Howard Pouch, DO  [place]

## 2020-06-15 ENCOUNTER — Encounter: Payer: Self-pay | Admitting: Family Medicine

## 2020-06-15 ENCOUNTER — Telehealth: Payer: Self-pay

## 2020-06-15 LAB — URINALYSIS W MICROSCOPIC + REFLEX CULTURE
Bacteria, UA: NONE SEEN /HPF
Bilirubin Urine: NEGATIVE
Glucose, UA: NEGATIVE
Hgb urine dipstick: NEGATIVE
Hyaline Cast: NONE SEEN /LPF
Ketones, ur: NEGATIVE
Leukocyte Esterase: NEGATIVE
Nitrites, Initial: NEGATIVE
Protein, ur: NEGATIVE
RBC / HPF: NONE SEEN /HPF (ref 0–2)
Specific Gravity, Urine: 1.016 (ref 1.001–1.035)
Squamous Epithelial / HPF: NONE SEEN /HPF (ref <=5)
WBC, UA: NONE SEEN /HPF (ref 0–5)
pH: 7.5 (ref 5.0–8.0)

## 2020-06-15 LAB — NO CULTURE INDICATED

## 2020-06-15 MED ORDER — TAMSULOSIN HCL 0.4 MG PO CAPS: 0.4000 mg | ORAL_CAPSULE | Freq: Every day | ORAL | 0 refills | Status: DC

## 2020-06-15 NOTE — Telephone Encounter (Signed)
Yes, I have called in the flomax for her. 1 tab daily. Start immediately.  Also try aleve every 12 hours with food- can be helpful to decrease inflammation in the ureters to allow easier passage of stone.  Lemonade changes urine Ph and can also help as well.

## 2020-06-15 NOTE — Telephone Encounter (Signed)
Please advise,  Pt is calling because she feels like her kidney stone has moved. She is in more pain today than yesterday and wanted to know if flomax would help. Pt states that it was mentioned in her appointment yesterday.

## 2020-06-15 NOTE — Telephone Encounter (Signed)
Spoke with patient regarding results/recommendations.  

## 2020-06-20 ENCOUNTER — Telehealth: Payer: Self-pay | Admitting: Family Medicine

## 2020-06-20 DIAGNOSIS — N2889 Other specified disorders of kidney and ureter: Secondary | ICD-10-CM

## 2020-06-20 NOTE — Telephone Encounter (Signed)
Please inform patient I have spoke with radiology and we can perform an MRI of the abdomen with and without contrast.  I have placed this order for her.  We will need to go through her insurance since it is a different order.  Once her insurance approves she will receive a call to schedule.   This is an order with MRI contrast.  There seems to be no shortage of MRI contrast, the contrast shortage is exclusive to CT scans.

## 2020-06-21 NOTE — Telephone Encounter (Signed)
Pt was advised of information. For further cost questions, advised to contact insurance.

## 2020-06-28 ENCOUNTER — Encounter: Payer: Self-pay | Admitting: Family Medicine

## 2020-06-29 NOTE — Telephone Encounter (Signed)
We can certainly prescribe something to help her with her nerves during her imaging study.  We would need to have a visit, certainly can be a video visit, to legally prescribe that medication to her since they are controlled substances. Please encourage her to schedule that appointment anytime between now and at least a week before her imaging study, we will discuss options and prescribed for her.

## 2020-06-30 NOTE — Telephone Encounter (Signed)
FYI  Please see below

## 2020-07-25 ENCOUNTER — Other Ambulatory Visit: Payer: Self-pay | Admitting: Family Medicine

## 2020-07-25 ENCOUNTER — Telehealth: Payer: Self-pay | Admitting: Family Medicine

## 2020-07-25 ENCOUNTER — Ambulatory Visit: Payer: Commercial Managed Care - PPO

## 2020-07-25 ENCOUNTER — Other Ambulatory Visit: Payer: Self-pay

## 2020-07-25 DIAGNOSIS — Z1231 Encounter for screening mammogram for malignant neoplasm of breast: Secondary | ICD-10-CM

## 2020-07-25 DIAGNOSIS — N2889 Other specified disorders of kidney and ureter: Secondary | ICD-10-CM

## 2020-07-25 MED ORDER — GADOBUTROL 1 MMOL/ML IV SOLN
9.0000 mL | Freq: Once | INTRAVENOUS | Status: AC | PRN
  Administered 2020-07-25: 9 mL via INTRAVENOUS

## 2020-07-25 NOTE — Telephone Encounter (Signed)
FYI

## 2020-07-25 NOTE — Telephone Encounter (Signed)
[facility] called to let yall know that the MRI results are now available to look at and if there are any question you can call them back at [phone]

## 2020-07-26 ENCOUNTER — Telehealth: Payer: Self-pay | Admitting: Family Medicine

## 2020-07-26 DIAGNOSIS — N2889 Other specified disorders of kidney and ureter: Secondary | ICD-10-CM

## 2020-07-26 NOTE — Telephone Encounter (Signed)
Discussed MRI results with pt. RIGHT renal lesion concerning for RCC. Placed urgent referral to urology.

## 2020-07-26 NOTE — Telephone Encounter (Signed)
Pt saw results in mychart and concerned. Advised pt she will be contacted once reviewed by provider.

## 2020-07-29 ENCOUNTER — Telehealth: Payer: Self-pay

## 2020-07-29 NOTE — Telephone Encounter (Signed)
Pt called to inform PCP that she has gotten an appt with [facility], Dr. Gloriann Loan, on 08/17/20 at 1:00 pm.

## 2020-08-17 ENCOUNTER — Ambulatory Visit (HOSPITAL_COMMUNITY)
Admission: RE | Admit: 2020-08-17 | Discharge: 2020-08-17 | Disposition: A | Discharge status: Home / Self Care | Payer: Commercial Managed Care - PPO | Source: Ambulatory Visit | Attending: Urology | Admitting: Urology

## 2020-08-17 ENCOUNTER — Other Ambulatory Visit (HOSPITAL_COMMUNITY): Payer: Self-pay | Admitting: Urology

## 2020-08-17 ENCOUNTER — Other Ambulatory Visit: Payer: Self-pay

## 2020-08-17 DIAGNOSIS — D4101 Neoplasm of uncertain behavior of right kidney: Secondary | ICD-10-CM

## 2020-08-22 ENCOUNTER — Encounter: Payer: Self-pay | Admitting: Family Medicine

## 2020-08-25 ENCOUNTER — Encounter: Payer: Self-pay | Admitting: Family Medicine

## 2020-08-31 ENCOUNTER — Other Ambulatory Visit: Payer: Self-pay | Admitting: Urology

## 2020-09-15 ENCOUNTER — Ambulatory Visit: Payer: Commercial Managed Care - PPO

## 2020-10-06 NOTE — Progress Notes (Addendum)
COVID swab appointment: 10/19/20  COVID Vaccine Completed: yes x2 Date COVID Vaccine completed: 05/07/19, 06/08/19 Has received booster: COVID vaccine manufacturer: Pfizer      Date of COVID positive in last 90 days: No  PCP - Howard Pouch, DO Cardiologist - N/a  Chest x-ray - 08/17/20 Epic EKG - N/a Stress Test - N/a ECHO - N/a Cardiac Cath - N/a Pacemaker/ICD device last checked: N/a Spinal Cord Stimulator: N/a  Sleep Study - N/a CPAP -   Fasting Blood Sugar - pre DM per pt Checks Blood Sugar _____ times a day  Patient refused CBG check. Says she does not like her fingers being poked. She is getting a BMP.  Blood Thinner Instructions: N/a Aspirin Instructions: Last Dose:  Activity level: Can go up a flight of stairs and perform activities of daily living without stopping and without symptoms of chest pain or shortness of breath.    Anesthesia review:   Patient denies shortness of breath, fever, cough and chest pain at PAT appointment   Patient verbalized understanding of instructions that were given to them at the PAT appointment. Patient was also instructed that they will need to review over the PAT instructions again at home before surgery.

## 2020-10-07 ENCOUNTER — Encounter (HOSPITAL_COMMUNITY): Payer: Self-pay

## 2020-10-07 ENCOUNTER — Other Ambulatory Visit: Payer: Self-pay

## 2020-10-07 ENCOUNTER — Encounter (HOSPITAL_COMMUNITY)
Admission: RE | Admit: 2020-10-07 | Discharge: 2020-10-07 | Disposition: A | Discharge status: Home / Self Care | Payer: Commercial Managed Care - PPO | Source: Ambulatory Visit | Attending: Urology | Admitting: Urology

## 2020-10-07 DIAGNOSIS — Z01812 Encounter for preprocedural laboratory examination: Secondary | ICD-10-CM | POA: Diagnosis present

## 2020-10-07 HISTORY — DX: Personal history of urinary calculi: Z87.442

## 2020-10-07 HISTORY — DX: Family history of other specified conditions: Z84.89

## 2020-10-07 LAB — BASIC METABOLIC PANEL
Anion gap: 9 (ref 5–15)
BUN: 22 mg/dL — ABNORMAL HIGH (ref 6–20)
CO2: 27 mmol/L (ref 22–32)
Calcium: 9.8 mg/dL (ref 8.9–10.3)
Chloride: 108 mmol/L (ref 98–111)
Creatinine, Ser: 0.73 mg/dL (ref 0.44–1.00)
GFR, Estimated: >60 mL/min (ref >60)
Glucose, Bld: 91 mg/dL (ref 70–99)
Potassium: 4.1 mmol/L (ref 3.5–5.1)
Sodium: 144 mmol/L (ref 135–145)

## 2020-10-07 LAB — CBC
HCT: 43.7 % (ref 36.0–46.0)
Hemoglobin: 14.3 g/dL (ref 12.0–15.0)
MCH: 29.7 pg (ref 26.0–34.0)
MCHC: 32.7 g/dL (ref 30.0–36.0)
MCV: 90.7 fL (ref 80.0–100.0)
Platelets: 293 10*3/uL (ref 150–400)
RBC: 4.82 MIL/uL (ref 3.87–5.11)
RDW: 12.4 % (ref 11.5–15.5)
WBC: 11 10*3/uL — ABNORMAL HIGH (ref 4.0–10.5)
nRBC: 0 % (ref 0.0–0.2)

## 2020-10-07 LAB — HEMOGLOBIN A1C
Hgb A1c MFr Bld: 6.5 % — ABNORMAL HIGH (ref 4.8–5.6)
Mean Plasma Glucose: 139.85 mg/dL

## 2020-10-07 NOTE — Patient Instructions (Addendum)
DUE TO COVID-19 ONLY ONE VISITOR IS ALLOWED TO COME WITH YOU AND STAY IN THE WAITING ROOM ONLY DURING PRE OP AND PROCEDURE.   **NO VISITORS ARE ALLOWED IN THE SHORT STAY AREA OR RECOVERY ROOM!!**  IF YOU WILL BE ADMITTED INTO THE HOSPITAL YOU ARE ALLOWED ONLY TWO SUPPORT PEOPLE DURING VISITATION HOURS ONLY (10AM -8PM)   The support person(s) may change daily. The support person(s) must pass our screening, gel in and out, and wear a mask at all times, including in the patient's room. Patients must also wear a mask when staff or their support person are in the room.  No visitors under the age of 35. Any visitor under the age of 60 must be accompanied by an adult.    COVID SWAB TESTING MUST BE COMPLETED ON:  10/19/20 **MUST PRESENT COMPLETED FORM AT TESTING SITE**    Angela Nielsen (backside of the building) You are not required to quarantine, however you are required to wear a well-fitted mask when you are out and around people not in your household.  Hand Hygiene often Do NOT share personal items Notify your provider if you are in close contact with someone who has COVID or you develop fever 100.4 or greater, new onset of sneezing, cough, sore throat, shortness of breath or body aches.   Your procedure is scheduled on: 10/21/20   Report to [facility] Main  Entrance    Report to admitting at 9:45 AM   Call this number if you have problems the morning of surgery [phone]   Do not eat food :After Midnight.   May have liquids until 9:00 AM day of surgery  CLEAR LIQUID DIET  Foods Allowed                                                                     Foods Excluded  Water, Black Coffee and tea (No milk or creamer)          liquids that you cannot  Plain Jell-O in any flavor  (No red)                                    see through such as: Fruit ices (not with fruit pulp)                                           milk, soups, orange juice               Iced Popsicles (No red)                                               All solid food                                   Apple juices Sports drinks like Gatorade (No red) Lightly seasoned  clear broth or consume(fat free) Sugar    Oral Hygiene is also important to reduce your risk of infection.                                    Remember - BRUSH YOUR TEETH THE MORNING OF SURGERY WITH YOUR REGULAR TOOTHPASTE   Take these medicines the morning of surgery with A SIP OF WATER: None                              You may not have any metal on your body including hair pins, jewelry, and body piercing             Do not wear make-up, lotions, powders, perfumes, or deodorant  Do not wear nail polish including gel and S&S, artificial/acrylic nails, or any other type of covering on natural nails including finger and toenails. If you have artificial nails, gel coating, etc. that needs to be removed by a nail salon please have this removed prior to surgery or surgery may need to be canceled/ delayed if the surgeon/ anesthesia feels like they are unable to be safely monitored.   Do not shave  48 hours prior to surgery.    Do not bring valuables to the hospital. [place].   Bring small overnight bag day of surgery.  Please read over the following fact sheets you were given: IF YOU HAVE QUESTIONS ABOUT YOUR PRE OP INSTRUCTIONS PLEASE CALL [facility] - Preparing for Surgery Before surgery, you can play an important role.  Because skin is not sterile, your skin needs to be as free of germs as possible.  You can reduce the number of germs on your skin by washing with CHG (chlorahexidine gluconate) soap before surgery.  CHG is an antiseptic cleaner which kills germs and bonds with the skin to continue killing germs even after washing. Please DO NOT use if you have an allergy to CHG or antibacterial soaps.  If your skin becomes  reddened/irritated stop using the CHG and inform your nurse when you arrive at Short Stay. Do not shave (including legs and underarms) for at least 48 hours prior to the first CHG shower.  You may shave your face/neck.  Please follow these instructions carefully:  1.  Shower with CHG Soap the night before surgery and the  morning of surgery.  2.  If you choose to wash your hair, wash your hair first as usual with your normal  shampoo.  3.  After you shampoo, rinse your hair and body thoroughly to remove the shampoo.                             4.  Use CHG as you would any other liquid soap.  You can apply chg directly to the skin and wash.  Gently with a scrungie or clean washcloth.  5.  Apply the CHG Soap to your body ONLY FROM THE NECK DOWN.   Do   not use on face/ open                           Wound or open sores. Avoid  contact with eyes, ears mouth and   genitals (private parts).                       Wash face,  Genitals (private parts) with your normal soap.             6.  Wash thoroughly, paying special attention to the area where your    surgery  will be performed.  7.  Thoroughly rinse your body with warm water from the neck down.  8.  DO NOT shower/wash with your normal soap after using and rinsing off the CHG Soap.                9.  Pat yourself dry with a clean towel.            10.  Wear clean pajamas.            11.  Place clean sheets on your bed the night of your first shower and do not  sleep with pets. Day of Surgery : Do not apply any lotions/deodorants the morning of surgery.  Please wear clean clothes to the hospital/surgery center.  FAILURE TO FOLLOW THESE INSTRUCTIONS MAY RESULT IN THE CANCELLATION OF YOUR SURGERY  PATIENT SIGNATURE_________________________________  NURSE SIGNATURE__________________________________  ________________________________________________________________________

## 2020-10-19 ENCOUNTER — Other Ambulatory Visit: Payer: Self-pay | Admitting: Urology

## 2020-10-19 LAB — SARS CORONAVIRUS 2 (TAT 6-24 HRS): SARS Coronavirus 2: NEGATIVE

## 2020-10-21 ENCOUNTER — Ambulatory Visit (HOSPITAL_COMMUNITY): Payer: Commercial Managed Care - PPO | Admitting: Certified Registered"

## 2020-10-21 ENCOUNTER — Observation Stay (HOSPITAL_COMMUNITY)
Admission: RE | Admit: 2020-10-21 | Discharge: 2020-10-23 | Disposition: A | Discharge status: Home / Self Care | Payer: Commercial Managed Care - PPO | Attending: Urology | Admitting: Urology

## 2020-10-21 ENCOUNTER — Other Ambulatory Visit: Payer: Self-pay

## 2020-10-21 ENCOUNTER — Encounter (HOSPITAL_COMMUNITY): Payer: Self-pay | Admitting: Urology

## 2020-10-21 ENCOUNTER — Encounter (HOSPITAL_COMMUNITY)
Admission: RE | Disposition: A | Discharge status: Home / Self Care | Payer: Self-pay | Source: Home / Self Care | Attending: Urology

## 2020-10-21 DIAGNOSIS — Z85828 Personal history of other malignant neoplasm of skin: Secondary | ICD-10-CM | POA: Diagnosis not present

## 2020-10-21 DIAGNOSIS — D49511 Neoplasm of unspecified behavior of right kidney: Secondary | ICD-10-CM | POA: Diagnosis present

## 2020-10-21 DIAGNOSIS — C641 Malignant neoplasm of right kidney, except renal pelvis: Principal | ICD-10-CM | POA: Insufficient documentation

## 2020-10-21 DIAGNOSIS — N2889 Other specified disorders of kidney and ureter: Secondary | ICD-10-CM | POA: Diagnosis present

## 2020-10-21 HISTORY — PX: ROBOTIC ASSITED PARTIAL NEPHRECTOMY: SHX6087

## 2020-10-21 LAB — HEMOGLOBIN AND HEMATOCRIT, BLOOD
HCT: 40.6 % (ref 36.0–46.0)
Hemoglobin: 13.5 g/dL (ref 12.0–15.0)

## 2020-10-21 LAB — TYPE AND SCREEN
ABO/RH(D): O POS
Antibody Screen: NEGATIVE

## 2020-10-21 LAB — ABO/RH: ABO/RH(D): O POS

## 2020-10-21 SURGERY — NEPHRECTOMY, PARTIAL, ROBOT-ASSISTED
Anesthesia: General | Laterality: Right

## 2020-10-21 MED ORDER — FENTANYL CITRATE (PF) 250 MCG/5ML IJ SOLN
INTRAMUSCULAR | Status: DC | PRN
  Administered 2020-10-21 (×4): 50 ug via INTRAVENOUS

## 2020-10-21 MED ORDER — DEXTROSE-NACL 5-0.45 % IV SOLN
INTRAVENOUS | Status: DC
Start: 2020-10-21 — End: 2020-10-22

## 2020-10-21 MED ORDER — KETOROLAC TROMETHAMINE 30 MG/ML IJ SOLN: 30.0000 mg | Freq: Once | INTRAMUSCULAR | Status: DC | PRN

## 2020-10-21 MED ORDER — SUGAMMADEX SODIUM 200 MG/2ML IV SOLN
INTRAVENOUS | Status: DC | PRN
  Administered 2020-10-21: 200 mg via INTRAVENOUS

## 2020-10-21 MED ORDER — DOCUSATE SODIUM 100 MG PO CAPS: 100.0000 mg | ORAL_CAPSULE | Freq: Two times a day (BID) | ORAL | Status: DC

## 2020-10-21 MED ORDER — PROPOFOL 10 MG/ML IV BOLUS
INTRAVENOUS | Status: AC
  Filled 2020-10-21: qty 20

## 2020-10-21 MED ORDER — HYDROCODONE-ACETAMINOPHEN 5-325 MG PO TABS: 1.0000 | ORAL_TABLET | Freq: Four times a day (QID) | ORAL | 0 refills | Status: DC | PRN

## 2020-10-21 MED ORDER — DIPHENHYDRAMINE HCL 50 MG/ML IJ SOLN: 12.5000 mg | Freq: Four times a day (QID) | INTRAMUSCULAR | Status: DC | PRN

## 2020-10-21 MED ORDER — ONDANSETRON HCL 4 MG/2ML IJ SOLN
INTRAMUSCULAR | Status: DC | PRN
  Administered 2020-10-21: 4 mg via INTRAVENOUS

## 2020-10-21 MED ORDER — SUCCINYLCHOLINE CHLORIDE 200 MG/10ML IV SOSY
PREFILLED_SYRINGE | INTRAVENOUS | Status: AC
  Filled 2020-10-21: qty 10

## 2020-10-21 MED ORDER — HYDROMORPHONE HCL 1 MG/ML IJ SOLN: 0.2500 mg | INTRAMUSCULAR | Status: DC | PRN

## 2020-10-21 MED ORDER — ROCURONIUM BROMIDE 10 MG/ML (PF) SYRINGE
PREFILLED_SYRINGE | INTRAVENOUS | Status: AC
  Filled 2020-10-21: qty 10

## 2020-10-21 MED ORDER — LIDOCAINE HCL (PF) 2 % IJ SOLN
INTRAMUSCULAR | Status: AC
  Filled 2020-10-21: qty 10

## 2020-10-21 MED ORDER — CEFAZOLIN SODIUM-DEXTROSE 2-4 GM/100ML-% IV SOLN
2.0000 g | INTRAVENOUS | Status: AC
  Administered 2020-10-21: 2 g via INTRAVENOUS
  Filled 2020-10-21: qty 100

## 2020-10-21 MED ORDER — LACTATED RINGERS IR SOLN
Status: DC | PRN
  Administered 2020-10-21: 1000 mL

## 2020-10-21 MED ORDER — PROMETHAZINE HCL 25 MG/ML IJ SOLN: 6.2500 mg | INTRAMUSCULAR | Status: DC | PRN

## 2020-10-21 MED ORDER — ACETAMINOPHEN 10 MG/ML IV SOLN
1000.0000 mg | Freq: Four times a day (QID) | INTRAVENOUS | Status: DC
  Administered 2020-10-21 – 2020-10-22 (×3): 1000 mg via INTRAVENOUS
  Filled 2020-10-21 (×4): qty 100

## 2020-10-21 MED ORDER — HYDROMORPHONE HCL 1 MG/ML IJ SOLN: 0.5000 mg | INTRAMUSCULAR | Status: DC | PRN

## 2020-10-21 MED ORDER — CHLORHEXIDINE GLUCONATE 0.12 % MT SOLN
15.0000 mL | Freq: Once | OROMUCOSAL | Status: AC
  Administered 2020-10-21: 15 mL via OROMUCOSAL

## 2020-10-21 MED ORDER — SCOPOLAMINE 1 MG/3DAYS TD PT72
1.0000 | MEDICATED_PATCH | TRANSDERMAL | Status: DC
  Administered 2020-10-21: 1.5 mg via TRANSDERMAL
  Filled 2020-10-21: qty 1

## 2020-10-21 MED ORDER — DOCUSATE SODIUM 100 MG PO CAPS
100.0000 mg | ORAL_CAPSULE | Freq: Two times a day (BID) | ORAL | Status: DC
  Administered 2020-10-21 – 2020-10-23 (×4): 100 mg via ORAL
  Filled 2020-10-21 (×4): qty 1

## 2020-10-21 MED ORDER — OXYCODONE HCL 5 MG/5ML PO SOLN: 5.0000 mg | Freq: Once | ORAL | Status: DC | PRN

## 2020-10-21 MED ORDER — OXYCODONE HCL 5 MG PO TABS: 5.0000 mg | ORAL_TABLET | Freq: Once | ORAL | Status: DC | PRN

## 2020-10-21 MED ORDER — ONDANSETRON HCL 4 MG/2ML IJ SOLN
INTRAMUSCULAR | Status: AC
  Filled 2020-10-21: qty 2

## 2020-10-21 MED ORDER — PHENYLEPHRINE 40 MCG/ML (10ML) SYRINGE FOR IV PUSH (FOR BLOOD PRESSURE SUPPORT)
PREFILLED_SYRINGE | INTRAVENOUS | Status: DC | PRN
  Administered 2020-10-21: 40 ug via INTRAVENOUS

## 2020-10-21 MED ORDER — CEFAZOLIN SODIUM-DEXTROSE 1-4 GM/50ML-% IV SOLN
1.0000 g | Freq: Three times a day (TID) | INTRAVENOUS | Status: AC
  Administered 2020-10-21 – 2020-10-22 (×2): 1 g via INTRAVENOUS
  Filled 2020-10-21 (×3): qty 50

## 2020-10-21 MED ORDER — MEPERIDINE HCL 50 MG/ML IJ SOLN: 6.2500 mg | INTRAMUSCULAR | Status: DC | PRN

## 2020-10-21 MED ORDER — MIDAZOLAM HCL 2 MG/2ML IJ SOLN
INTRAMUSCULAR | Status: DC | PRN
  Administered 2020-10-21: 2 mg via INTRAVENOUS

## 2020-10-21 MED ORDER — DIPHENHYDRAMINE HCL 12.5 MG/5ML PO ELIX
12.5000 mg | ORAL_SOLUTION | Freq: Four times a day (QID) | ORAL | Status: DC | PRN
Start: 2020-10-21 — End: 2020-10-23

## 2020-10-21 MED ORDER — STERILE WATER FOR IRRIGATION IR SOLN
Status: DC | PRN
  Administered 2020-10-21: 1000 mL

## 2020-10-21 MED ORDER — PROPOFOL 10 MG/ML IV BOLUS
INTRAVENOUS | Status: DC | PRN
  Administered 2020-10-21: 150 mg via INTRAVENOUS

## 2020-10-21 MED ORDER — PROMETHAZINE HCL 12.5 MG PO TABS: 12.5000 mg | ORAL_TABLET | ORAL | 0 refills | Status: DC | PRN

## 2020-10-21 MED ORDER — BUPIVACAINE LIPOSOME 1.3 % IJ SUSP
INTRAMUSCULAR | Status: AC
  Filled 2020-10-21: qty 20

## 2020-10-21 MED ORDER — LACTATED RINGERS IV SOLN: INTRAVENOUS | Status: DC

## 2020-10-21 MED ORDER — OXYCODONE HCL 5 MG PO TABS: 5.0000 mg | ORAL_TABLET | ORAL | Status: DC | PRN

## 2020-10-21 MED ORDER — SODIUM CHLORIDE (PF) 0.9 % IJ SOLN
INTRAMUSCULAR | Status: AC
  Filled 2020-10-21: qty 20

## 2020-10-21 MED ORDER — BUPIVACAINE LIPOSOME 1.3 % IJ SUSP
INTRAMUSCULAR | Status: DC | PRN
  Administered 2020-10-21: 20 mL

## 2020-10-21 MED ORDER — ONDANSETRON HCL 4 MG/2ML IJ SOLN
4.0000 mg | INTRAMUSCULAR | Status: DC | PRN
Start: 2020-10-21 — End: 2020-10-23

## 2020-10-21 MED ORDER — SODIUM CHLORIDE (PF) 0.9 % IJ SOLN
INTRAMUSCULAR | Status: DC | PRN
  Administered 2020-10-21: 20 mL

## 2020-10-21 MED ORDER — ORAL CARE MOUTH RINSE: 15.0000 mL | Freq: Once | OROMUCOSAL | Status: AC

## 2020-10-21 MED ORDER — FENTANYL CITRATE (PF) 100 MCG/2ML IJ SOLN
INTRAMUSCULAR | Status: AC
  Filled 2020-10-21: qty 2

## 2020-10-21 MED ORDER — ROCURONIUM BROMIDE 10 MG/ML (PF) SYRINGE
PREFILLED_SYRINGE | INTRAVENOUS | Status: DC | PRN
  Administered 2020-10-21: 20 mg via INTRAVENOUS
  Administered 2020-10-21: 100 mg via INTRAVENOUS
  Administered 2020-10-21: 20 mg via INTRAVENOUS

## 2020-10-21 MED ORDER — EPHEDRINE SULFATE-NACL 50-0.9 MG/10ML-% IV SOSY
PREFILLED_SYRINGE | INTRAVENOUS | Status: DC | PRN
  Administered 2020-10-21: 5 mg via INTRAVENOUS

## 2020-10-21 MED ORDER — LIDOCAINE 2% (20 MG/ML) 5 ML SYRINGE
INTRAMUSCULAR | Status: DC | PRN
  Administered 2020-10-21: 60 mg via INTRAVENOUS

## 2020-10-21 MED ORDER — PHENYLEPHRINE 40 MCG/ML (10ML) SYRINGE FOR IV PUSH (FOR BLOOD PRESSURE SUPPORT)
PREFILLED_SYRINGE | INTRAVENOUS | Status: AC
  Filled 2020-10-21: qty 10

## 2020-10-21 MED ORDER — BELLADONNA ALKALOIDS-OPIUM 16.2-60 MG RE SUPP
1.0000 | Freq: Four times a day (QID) | RECTAL | Status: DC | PRN
  Administered 2020-10-22: 1 via RECTAL
  Filled 2020-10-21: qty 1

## 2020-10-21 MED ORDER — ACETAMINOPHEN 500 MG PO TABS
1000.0000 mg | ORAL_TABLET | Freq: Once | ORAL | Status: AC
  Administered 2020-10-21: 1000 mg via ORAL
  Filled 2020-10-21: qty 2

## 2020-10-21 MED ORDER — "VISTASEAL 4 ML SINGLE DOSE KIT "
4.0000 mL | PACK | Freq: Once | CUTANEOUS | Status: AC
  Administered 2020-10-21: 4 mL via TOPICAL
  Filled 2020-10-21: qty 4

## 2020-10-21 MED ORDER — MIDAZOLAM HCL 2 MG/2ML IJ SOLN
INTRAMUSCULAR | Status: AC
  Filled 2020-10-21: qty 2

## 2020-10-21 MED ORDER — DEXAMETHASONE SODIUM PHOSPHATE 10 MG/ML IJ SOLN
INTRAMUSCULAR | Status: AC
  Filled 2020-10-21: qty 1

## 2020-10-21 MED ORDER — PHENYLEPHRINE HCL-NACL 20-0.9 MG/250ML-% IV SOLN
INTRAVENOUS | Status: DC | PRN
  Administered 2020-10-21: 20 ug/min via INTRAVENOUS

## 2020-10-21 MED ORDER — DEXAMETHASONE SODIUM PHOSPHATE 10 MG/ML IJ SOLN
INTRAMUSCULAR | Status: DC | PRN
  Administered 2020-10-21: 8 mg via INTRAVENOUS

## 2020-10-21 SURGICAL SUPPLY — 67 items
APPLICATOR VISTASEAL 35 (MISCELLANEOUS) ×2 IMPLANT
BAG COUNTER SPONGE SURGICOUNT (BAG) IMPLANT
CHLORAPREP W/TINT 26 (MISCELLANEOUS) ×2 IMPLANT
CLIP LIGATING HEMO LOK XL GOLD (MISCELLANEOUS) IMPLANT
CLIP LIGATING HEMO O LOK GREEN (MISCELLANEOUS) ×8 IMPLANT
CLIP SUT LAPRA TY ABSORB (SUTURE) ×6 IMPLANT
COVER SURGICAL LIGHT HANDLE (MISCELLANEOUS) ×2 IMPLANT
COVER TIP SHEARS 8 DVNC (MISCELLANEOUS) ×1 IMPLANT
COVER TIP SHEARS 8MM DA VINCI (MISCELLANEOUS) ×1
CUTTER ECHEON FLEX ENDO 45 340 (ENDOMECHANICALS) IMPLANT
DECANTER SPIKE VIAL GLASS SM (MISCELLANEOUS) ×2 IMPLANT
DERMABOND ADVANCED (GAUZE/BANDAGES/DRESSINGS) ×1
DERMABOND ADVANCED .7 DNX12 (GAUZE/BANDAGES/DRESSINGS) ×1 IMPLANT
DRAIN CHANNEL 15F RND FF 3/16 (WOUND CARE) IMPLANT
DRAPE ARM DVNC X/XI (DISPOSABLE) ×4 IMPLANT
DRAPE COLUMN DVNC XI (DISPOSABLE) ×1 IMPLANT
DRAPE DA VINCI XI ARM (DISPOSABLE) ×4
DRAPE DA VINCI XI COLUMN (DISPOSABLE) ×1
DRAPE INCISE IOBAN 66X45 STRL (DRAPES) ×2 IMPLANT
DRAPE SHEET LG 3/4 BI-LAMINATE (DRAPES) ×2 IMPLANT
ELECT PENCIL ROCKER SW 15FT (MISCELLANEOUS) ×2 IMPLANT
ELECT REM PT RETURN 15FT ADLT (MISCELLANEOUS) ×2 IMPLANT
EVACUATOR SILICONE 100CC (DRAIN) IMPLANT
GLOVE SRG 8 PF TXTR STRL LF DI (GLOVE) ×1 IMPLANT
GLOVE SURG ENC MOIS LTX SZ6.5 (GLOVE) ×2 IMPLANT
GLOVE SURG ENC TEXT LTX SZ7.5 (GLOVE) ×4 IMPLANT
GLOVE SURG UNDER POLY LF SZ8 (GLOVE) ×1
GOWN STRL REUS W/TWL LRG LVL3 (GOWN DISPOSABLE) ×2 IMPLANT
GOWN STRL REUS W/TWL XL LVL3 (GOWN DISPOSABLE) ×4 IMPLANT
HEMOSTAT SURGICEL 4X8 (HEMOSTASIS) ×4 IMPLANT
HOLDER FOLEY CATH W/STRAP (MISCELLANEOUS) ×2 IMPLANT
IRRIG SUCT STRYKERFLOW 2 WTIP (MISCELLANEOUS) ×2
IRRIGATION SUCT STRKRFLW 2 WTP (MISCELLANEOUS) ×1 IMPLANT
KIT BASIN OR (CUSTOM PROCEDURE TRAY) ×2 IMPLANT
KIT TURNOVER KIT A (KITS) ×2 IMPLANT
LOOP VESSEL MAXI BLUE (MISCELLANEOUS) IMPLANT
MARKER SKIN DUAL TIP RULER LAB (MISCELLANEOUS) ×2 IMPLANT
NEEDLE INSUFFLATION 14GA 120MM (NEEDLE) ×2 IMPLANT
POUCH SPECIMEN RETRIEVAL 10MM (ENDOMECHANICALS) ×2 IMPLANT
PROTECTOR NERVE ULNAR (MISCELLANEOUS) ×4 IMPLANT
SCISSORS LAP 5X45 EPIX DISP (ENDOMECHANICALS) ×2 IMPLANT
SEAL CANN UNIV 5-8 DVNC XI (MISCELLANEOUS) ×4 IMPLANT
SEAL XI 5MM-8MM UNIVERSAL (MISCELLANEOUS) ×4
SEALANT SURGICAL APPL DUAL CAN (MISCELLANEOUS) IMPLANT
SET TUBE SMOKE EVAC HIGH FLOW (TUBING) ×2 IMPLANT
SOLUTION ELECTROLUBE (MISCELLANEOUS) ×2 IMPLANT
STAPLE RELOAD 45 WHT (STAPLE) IMPLANT
STAPLE RELOAD 45MM WHITE (STAPLE)
SUT ETHILON 2 0 PSLX (SUTURE) IMPLANT
SUT MNCRL AB 4-0 PS2 18 (SUTURE) ×4 IMPLANT
SUT PDS AB 0 CT1 36 (SUTURE) IMPLANT
SUT V-LOC BARB 180 2/0GR6 GS22 (SUTURE)
SUT VIC AB 1 CT1 36 (SUTURE) ×12 IMPLANT
SUT VIC AB 3-0 SH 27 (SUTURE) ×3
SUT VIC AB 3-0 SH 27X BRD (SUTURE) ×3 IMPLANT
SUT VICRYL 0 UR6 27IN ABS (SUTURE) IMPLANT
SUT VLOC BARB 180 ABS3/0GR12 (SUTURE) ×6
SUTURE V-LC BRB 180 2/0GR6GS22 (SUTURE) IMPLANT
SUTURE VLOC BRB 180 ABS3/0GR12 (SUTURE) ×3 IMPLANT
TOWEL OR 17X26 10 PK STRL BLUE (TOWEL DISPOSABLE) ×2 IMPLANT
TOWEL OR NON WOVEN STRL DISP B (DISPOSABLE) ×2 IMPLANT
TRAY FOLEY MTR SLVR 16FR STAT (SET/KITS/TRAYS/PACK) ×2 IMPLANT
TRAY LAPAROSCOPIC (CUSTOM PROCEDURE TRAY) ×2 IMPLANT
TROCAR BLADELESS OPT 5 100 (ENDOMECHANICALS) IMPLANT
TROCAR UNIVERSAL OPT 12M 100M (ENDOMECHANICALS) IMPLANT
TROCAR XCEL 12X100 BLDLESS (ENDOMECHANICALS) ×2 IMPLANT
WATER STERILE IRR 1000ML POUR (IV SOLUTION) ×2 IMPLANT

## 2020-10-21 NOTE — Discharge Instructions (Signed)

## 2020-10-21 NOTE — Anesthesia Procedure Notes (Signed)
Procedure Name: Intubation Date/Time: 10/21/2020 12:36 PM Performed by: Eben Burow, CRNA Pre-anesthesia Checklist: Patient identified, Emergency Drugs available, Suction available, Patient being monitored and Timeout performed Patient Re-evaluated:Patient Re-evaluated prior to induction Oxygen Delivery Method: Circle system utilized Preoxygenation: Pre-oxygenation with 100% oxygen Induction Type: IV induction Ventilation: Mask ventilation without difficulty Laryngoscope Size: Mac and 4 Grade View: Grade II Tube type: Oral Tube size: 7.0 mm Number of attempts: 1 Airway Equipment and Method: Stylet Placement Confirmation: ETT inserted through vocal cords under direct vision, positive ETCO2 and breath sounds checked- equal and bilateral Secured at: 21 cm Tube secured with: Tape Dental Injury: Teeth and Oropharynx as per pre-operative assessment

## 2020-10-21 NOTE — Transfer of Care (Signed)
Immediate Anesthesia Transfer of Care Note  Patient: Maricruz Muhlestein  Procedure(s) Performed: RIGHT ROBOTIC PARTIAL NEPHRECTOMY (Right)  Patient Location: PACU  Anesthesia Type:General  Level of Consciousness: drowsy and patient cooperative  Airway & Oxygen Therapy: Patient Spontanous Breathing and Patient connected to face mask oxygen  Post-op Assessment: Report given to RN and Post -op Vital signs reviewed and stable  Post vital signs: Reviewed and stable  Last Vitals:  Vitals Value Taken Time  BP 146/75 10/21/20 1601  Temp    Pulse 72 10/21/20 1604  Resp 27 10/21/20 1604  SpO2 96 % 10/21/20 1604  Vitals shown include unvalidated device data.  Last Pain:  Vitals:   10/21/20 1018  TempSrc:   PainSc: 0-No pain         Complications: No notable events documented.

## 2020-10-21 NOTE — Anesthesia Postprocedure Evaluation (Signed)
Anesthesia Post Note  Patient: Angela Nielsen  Procedure(s) Performed: RIGHT ROBOTIC PARTIAL NEPHRECTOMY (Right)     Patient location during evaluation: PACU Anesthesia Type: General Level of consciousness: sedated and patient cooperative Pain management: pain level controlled Vital Signs Assessment: post-procedure vital signs reviewed and stable Respiratory status: spontaneous breathing Cardiovascular status: stable Anesthetic complications: no   No notable events documented.  Last Vitals:  Vitals:   10/21/20 1719 10/21/20 1739  BP: 134/80 134/73  Pulse:  63  Resp:  20  Temp: 36.4 C   SpO2:  97%    Last Pain:  Vitals:   10/21/20 1751  TempSrc:   PainSc: 0-No pain                 Nolon Nations

## 2020-10-21 NOTE — Anesthesia Preprocedure Evaluation (Addendum)
Anesthesia Evaluation  Patient identified by MRN, date of birth, ID band Patient awake    Reviewed: Allergy & Precautions, NPO status , Patient's Chart, lab work & pertinent test results  History of Anesthesia Complications Negative for: history of anesthetic complications  Airway Mallampati: III  TM Distance: >3 FB Neck ROM: Full    Dental  (+) Teeth Intact, Dental Advisory Given   Pulmonary  Snores at night, no witnessed apneas, never had a sleep study    Pulmonary exam normal breath sounds clear to auscultation       Cardiovascular negative cardio ROS Normal cardiovascular exam Rhythm:Regular Rate:Normal     Neuro/Psych PSYCHIATRIC DISORDERS Depression negative neurological ROS     GI/Hepatic negative GI ROS, Neg liver ROS,   Endo/Other  Obesity BMI 32  Renal/GU Renal diseaseRight renal mass  negative genitourinary   Musculoskeletal negative musculoskeletal ROS (+)   Abdominal (+) + obese,   Peds  Hematology negative hematology ROS (+) hct 43.7   Anesthesia Other Findings   Reproductive/Obstetrics negative OB ROS                            Anesthesia Physical Anesthesia Plan  ASA: 2  Anesthesia Plan: General   Post-op Pain Management:    Induction: Intravenous  PONV Risk Score and Plan: 4 or greater and Ondansetron, Dexamethasone, Midazolam, Scopolamine patch - Pre-op and Treatment may vary due to age or medical condition  Airway Management Planned: Oral ETT  Additional Equipment: None  Intra-op Plan:   Post-operative Plan: Extubation in OR  Informed Consent: I have reviewed the patients History and Physical, chart, labs and discussed the procedure including the risks, benefits and alternatives for the proposed anesthesia with the patient or authorized representative who has indicated his/her understanding and acceptance.     Dental advisory given  Plan Discussed  with: CRNA  Anesthesia Plan Comments:        Anesthesia Quick Evaluation

## 2020-10-21 NOTE — Op Note (Signed)
Operative Note  Preoperative diagnosis:  1.  4.4 cm right renal mass  Postoperative diagnosis: 1.  4.4 cm right renal mass  Procedure(s): 1.  Robot-assisted laparoscopic right partial nephrectomy 2.  Intraoperative ultrasound of single retroperitoneal organ  Surgeon: Ellison Hughs, MD  Assistants: Debbrah Alar, PA-C  An assistant was required for this surgical procedure.  The duties of the assistant included but were not limited to suctioning, passing suture, camera manipulation, retraction.  This procedure would not be able to be performed without an Environmental consultant.   Anesthesia:  General  Complications:  None  EBL: 490 mL  Specimens: 1.  Right renal mass  Drains/Catheters: 1.  Foley catheter  Intraoperative findings:   4 cm right upper pole renal mass with heterogenous echogenicity on intraoperative renal ultrasound.  Features concerning for renal cell carcinoma Margins grossly negative for tumor following resection  Indication:  Angela Nielsen is a 60 y.o. female with a solid enhancing 4.4 cm right upper pole renal mass with features concerning for renal cell carcinoma.  She has been consented for the above procedures, voices understanding and wishes to proceed.  Description of procedure:  After informed consent was signed, the patient was taken back to the operating room and properly anesthetized.  The patient was then placed in the left lateral decubitus position with all pressure points padded.  The abdomen was then prepped and draped in the usual sterile fashion.  A time-out was then performed.    An 8 mm incision was then made lateral to the right rectus muscle at the level of the right 12th rib.  A Veress needle was then used to access the abdominal cavity.  A saline drop test showed no signs of obstruction and aspiration of the Veress needle revealed no blood or sucus.  The abdominal cavity was then insufflated to 15 mmHg.  An 8 mm robotic trocar was then atraumatically  inserted into the abdominal cavity.  The robotic camera was then inserted through the port and inspection of the abdominal cavity revealed no evidence of adjacent organ or vessel injury.  We then placed three additional 8 mm robotic ports and a 15 mm assistant port in such as fashion as to triangulate the right renal hilum.  The robot was then docked into postion.   Using a combination of blunt and cold scissors dissection, the hepatic attachments were released from the abdominal sidewall.  The white line of Toldt along the ascending colon was then incised, allowing [place] to reflect the colon medially and expose the anterior surface of the right kidney.  The duodenum was then Kocherized medially, which abruptly led [place] to identification of the inferior vena cava.    Once the colon was adequately mobilized, we moved to the lower pole and identified the gonadal vein and ureter.  The gonadal vein was then left running parallel to the vena cava and the right ureter was reflected anteriorly.  Using cautious cautery, the overlying perihilar attachments were then released.  This yielded visualization of the renal hilum, which included a single right renal vein and a single right renal artery.  The perilymphatic tissue surrounding the right renal artery were carefully released so that the right renal artery was fully encircled.    We next turned our attention to defatting the kidney.  An anterior incision along Gerota's fascia was created and the kidney was fully mobilized.   The renal mass is identified at the upper pole pole.  Using intraoperative ultrasound, the tumor was  then carefully evaluated and demonstrated heterogenous echogenicity compared to the rest of the renal parenchyma. The resection margin was marked to allow for wide excision of the renal mass.  The renal hilum was once again identified and a bulldog clamp was placed.    Using cold scissors, the right renal mass was then carefully excised, leaving a  grossly negative margin.  Excision of the mass appeared complete with no tumor grossly remaining.  There is a 1 cm area of collecting system violation was reapproximated using a running 3-0 Vicryl suture.  The tumor was then placed in the right lower quadrant, to be retrieved following repair of the renal defect.  A running 3-0 V lock suture was then used to reapproximate the resection bed.  Tension was placed with hemo-lock clips.   The right renal capsule was then reapproximated using a 0 Vicryl suture on a CT-1 needle in an interrupted fashion using hemo-lock clips as a buttress.  Lapra-Ty's were then placed on each of the interrupted sutures.  The bulldog was then released, which warm ischemia time totaled 20 minutes.   Once hemostasis was achieved and the renal bed was irrigated, Surgicel and Vistaseal was then placed over the renorrhaphy.  Gerota's fascia was then reapproximated with a running v-lok suture and the mesocolonic fat along the descending colon was then reapproximated to the right abdominal sidewall using Hem-o-lok clips.  The renal mass was retrieved and placed in an Endo Catch bag.  The mass was extracted through the 15 mm assistant port.  The fascia of the assistant port was then reapproximated with a 0 Vicryl suture.    The abdomen was desufflated with all ports removed.  The skin was then reapproximated using running Monocryl and dressed appropriately.  The patient was then replaced in the supine position and was awakened from anesthesia without complications.  Warm Ischemia Time: 20 minutes  Plan: Monitor on the floor overnight

## 2020-10-21 NOTE — H&P (Signed)
Urology Preoperative H&P   Chief Complaint: Right renal mass  History of Present Illness: Angela Nielsen is a 60 y.o. female with a solid and enhancing renal mass measuring 4.4 cm involving the RIGHT kidney. The mass was initially discovered on non-contrasted CT in May of 2022 during a work-up for left flank pain--she was found to have a small left UVJ stone that she has since passed.   -Anatomy: Right upper pole, posterior. Single artery and vein  -Smoking history: non-smoker--exposed to 2nd hand smoke as a child  -Prior abdominal surgeries: C-section  -Renal function: Creatinine- 0.5, eGFR: 107  -History of kidney stones: Recently passed a small left ureteral stone. Asymptomatic today.  -School teacher  -Went to [employer]  -She reports dull right upper back discomfort for the past year, but denies gross hematuria or flank pain.    Past Medical History:  Diagnosis Date   Allergies    Chicken pox    Depression    Family history of adverse reaction to anesthesia    aunt difficult to wake up to medication no longer used   Fibrocystic breast disease    History of kidney stones    Kidney stone    Skin cancer    Basal cell     Past Surgical History:  Procedure Laterality Date   CESAREAN SECTION  1995   COLONOSCOPY  2015   MOHS SURGERY     nose and shoulder    TONSILLECTOMY AND ADENOIDECTOMY  1969    Allergies: No Known Allergies  Family History  Problem Relation Age of Onset   Breast cancer Mother        BRCA +   Heart disease Mother    Stroke Mother    Gout Father    COPD Father    Hyperlipidemia Father    Hypertension Brother    Hyperlipidemia Brother    Hypertension Brother     Social History:  reports that she has never smoked. She has never used smokeless tobacco. She reports current alcohol use. She reports that she does not currently use drugs.  ROS: A complete review of systems was performed.  All systems are negative except for pertinent findings as  noted.  Physical Exam:  Vital signs in last 24 hours: Temp:  [97.8 F (36.6 C)] 97.8 F (36.6 C) (09/16 1005) Pulse Rate:  [79] 79 (09/16 1005) Resp:  [16] 16 (09/16 1005) BP: (150)/(76) 150/76 (09/16 1005) SpO2:  [100 %] 100 % (09/16 1005) Weight:  [87.2 kg] 87.2 kg (09/16 1005) Constitutional:  Alert and oriented, No acute distress Cardiovascular: Regular rate and rhythm, No JVD Respiratory: Normal respiratory effort, Lungs clear bilaterally GI: Abdomen is soft, nontender, nondistended, no abdominal masses GU: No CVA tenderness Lymphatic: No lymphadenopathy Neurologic: Grossly intact, no focal deficits Psychiatric: Normal mood and affect  Laboratory Data:  No results for input(s): WBC, HGB, HCT, PLT in the last 72 hours.  No results for input(s): NA, K, CL, GLUCOSE, BUN, CALCIUM, CREATININE in the last 72 hours.  Invalid input(s): CO3   Results for orders placed or performed during the hospital encounter of 10/21/20 (from the past 24 hour(s))  Type and screen [place]     Status: None   Collection Time: 10/21/20 10:24 AM  Result Value Ref Range   ABO/RH(D) O POS    Antibody Screen NEG    Sample Expiration      10/24/2020,2359 Performed at [facility], [address]  [place],  [address]    Recent Results (from the past 240 hour(s))  SARS Coronavirus 2 (TAT 6-24 hrs)     Status: None   Collection Time: 10/19/20 12:00 AM  Result Value Ref Range Status   SARS Coronavirus 2 RESULT: NEGATIVE  Final    Comment: RESULT: NEGATIVESARS-CoV-2 INTERPRETATION:A NEGATIVE  test result means that SARS-CoV-2 RNA was not present in the specimen above the limit of detection of this test. This does not preclude a possible SARS-CoV-2 infection and should not be used as the  sole basis for patient management decisions. Negative results must be combined with clinical observations, patient history, and epidemiological information. Optimum  specimen types and timing for peak viral levels during infections caused by SARS-CoV-2  have not been determined. Collection of multiple specimens or types of specimens may be necessary to detect virus. Improper specimen collection and handling, sequence variability under primers/probes, or organism present below the limit of detection may  lead to false negative results. Positive and negative predictive values of testing are highly dependent on prevalence. False negative test results are more likely when prevalence of disease is high.The expected result is NEGATIVE.Fact S heet for  Healthcare Providers: LocalChronicle.no Sheet for Patients: SalonLookup.es Reference Range - Negative     Renal Function: No results for input(s): CREATININE in the last 168 hours. Estimated Creatinine Clearance: 84.6 mL/min (by C-G formula based on SCr of 0.73 mg/dL).  Radiologic Imaging: CLINICAL DATA: Evaluation of renal lesion seen on prior outside CT   EXAM:  MRI ABDOMEN WITHOUT AND WITH CONTRAST   TECHNIQUE:  Multiplanar multisequence MR imaging of the abdomen was performed  both before and after the administration of intravenous contrast.   CONTRAST: 66m GADAVIST GADOBUTROL 1 MMOL/ML IV SOLN   COMPARISON: None available at time of dictation.   FINDINGS:  Lower chest: Within normal limits.   Hepatobiliary: There is marked signal dropout throughout the hepatic  parenchyma on out of phase imaging consistent with severe hepatic  steatosis. No solid enhancing hepatic lesion. Gallbladder is  unremarkable. No biliary ductal dilation.   Pancreas: Fatty infiltration of the pancreatic head neck. No  pancreatic ductal dilation. Multiple small cystic pancreatic lesions  largest of which is in the uncinate process measuring 9 mm on image  22/12 with a second index lesion in the pancreatic tail measuring 8  mm on image 16/12, none of which  demonstrate suspicious wall  thickening or enhancement.   Spleen: Within normal limits in size and appearance.   Adrenals/Urinary Tract: Bilateral adrenal glands are unremarkable.   Enhancing heterogeneous lesion in the upper pole of the RIGHT kidney  which demonstrates extensive signal dropout on out of phase imaging  measuring 4.4 x 3.9 cm on image 22/1. Cystic 1.2 cm left upper pole  renal lesion which demonstrates a thin internal septation, without  suspicious postcontrast enhancement consistent with a benign Bosniak  classification 2 renal cyst. Cystic 5 mm left interpolar renal  lesion without internal septations or other suspicious features  consistent with a Bosniak classification 1 renal cysts. No  hydronephrosis.   Stomach/Bowel: Visualized portions within the abdomen are  unremarkable.   Vascular/Lymphatic: No pathologically enlarged lymph nodes  identified. No abdominal aortic aneurysm demonstrated.   Other: No abdominal ascites.   Musculoskeletal: No suspicious bone lesions identified.   IMPRESSION:  1. Enhancing heterogeneous 4.4 cm RIGHT upper pole renal lesion,  most consistent with a renal cell carcinoma likely of the clear cell  type. No evidence of  tumor in vein or abdominal metastases.  2. Cystic lesions in the pancreas measuring up to 9 mm in the  uncinate process and 8 mm in the pancreatic tail, none of which  demonstrate suspicious wall thickening or enhancement. These are  likely small side branch IPMN's. Recommend follow up pre and post  contrast MRI/MRCP or pancreatic protocol CT in 1 year. This  recommendation follows ACR consensus guidelines: Management of  Incidental Pancreatic Cysts: A White Paper of the ACR Incidental  Findings Committee. J Am Coll Radiol 2017;14:911-923.  3. Bosniak classification 1 and 2 left renal cyst.  4. Severe diffuse hepatic steatosis.   These results will be called to the ordering clinician or  representative by the  Radiologist Assistant, and communication  documented in the PACS or [employer].    Electronically Signed  By: Dahlia Bailiff MD  On: 07/25/2020 11:33  I independently reviewed the above imaging studies.  Assessment and Plan Angela Nielsen is a 60 y.o. female with a 4.4 cm right renal mass with features concerning for renal cell carcinoma  -I personally reviewed imaging results and films with the patient. We discussed that the mass in question has features concerning for malignancy. I explained the natural history of presumed renal cell carcinoma. I reviewed the AUA guidelines for evaluation and treatment of the small renal mass. The options of active surveillance, in situ tumor ablation, partial and radical nephrectomy was discussed. The risks of robotic RIGHT partial nephrectomy were discussed in detail including but not limited to: negative pathology, open conversion, completion nephrectomy, infection of the urinary tract/skin/abdominal cavity, VTE, MI/CVA, lymphatic leak, injury to adjacent solid/hollow viscus organs, bleeding requiring a blood transfusion, catastrophic bleeding, hernia formation, need for postoperative angioembolization, urinary leak requiring stent/drain, and other imponderables.       Ellison Hughs, MD 10/21/2020, 12:19 PM  [facility] Pager: [phone]

## 2020-10-22 ENCOUNTER — Encounter (HOSPITAL_COMMUNITY): Payer: Self-pay | Admitting: Urology

## 2020-10-22 DIAGNOSIS — C641 Malignant neoplasm of right kidney, except renal pelvis: Secondary | ICD-10-CM | POA: Diagnosis not present

## 2020-10-22 LAB — BASIC METABOLIC PANEL
Anion gap: 8 (ref 5–15)
BUN: 16 mg/dL (ref 6–20)
CO2: 26 mmol/L (ref 22–32)
Calcium: 9.1 mg/dL (ref 8.9–10.3)
Chloride: 104 mmol/L (ref 98–111)
Creatinine, Ser: 0.99 mg/dL (ref 0.44–1.00)
GFR, Estimated: >60 mL/min (ref >60)
Glucose, Bld: 171 mg/dL — ABNORMAL HIGH (ref 70–99)
Potassium: 4.3 mmol/L (ref 3.5–5.1)
Sodium: 138 mmol/L (ref 135–145)

## 2020-10-22 LAB — HEMOGLOBIN AND HEMATOCRIT, BLOOD
HCT: 36.7 % (ref 36.0–46.0)
Hemoglobin: 12.4 g/dL (ref 12.0–15.0)

## 2020-10-22 MED ORDER — ACETAMINOPHEN 325 MG PO TABS
650.0000 mg | ORAL_TABLET | Freq: Four times a day (QID) | ORAL | Status: DC | PRN
  Administered 2020-10-22 – 2020-10-23 (×4): 650 mg via ORAL
  Filled 2020-10-22 (×4): qty 2

## 2020-10-22 NOTE — Progress Notes (Signed)
Patient had not had any output  for 7 hours into the shift and complained of urgency to void and discomfort.Bladder scan was performed and recorded about 250 mls and  drainage bag did not have any output. Urology was notified and folley was removed. Patient voided about 500 mls of yellow clear urine.

## 2020-10-22 NOTE — Progress Notes (Signed)
Urology Inpatient Progress Report  Renal mass [N28.89]  Procedure(s): RIGHT ROBOTIC PARTIAL NEPHRECTOMY  1 Day Post-Op   Intv/Subj: No acute events overnight.  Patient was having low oral intake this afternoon   Active Problems:   Renal mass  Current Facility-Administered Medications  Medication Dose Route Frequency Provider Last Rate Last Admin   acetaminophen (TYLENOL) tablet 650 mg  650 mg Oral Q6H PRN Marton Redwood III, MD   650 mg at 10/22/20 1423   belladonna-opium (B&O) suppository 16.2-'60mg'$   1 suppository Rectal Q6H PRN Debbrah Alar, PA-C   1 suppository at 10/22/20 0058   diphenhydrAMINE (BENADRYL) injection 12.5-25 mg  12.5-25 mg Intravenous Q6H PRN Debbrah Alar, PA-C       Or   diphenhydrAMINE (BENADRYL) 12.5 MG/5ML elixir 12.5-25 mg  12.5-25 mg Oral Q6H PRN Debbrah Alar, PA-C       docusate sodium (COLACE) capsule 100 mg  100 mg Oral BID Dancy, Amanda, PA-C   100 mg at 10/22/20 1100   HYDROmorphone (DILAUDID) injection 0.5-1 mg  0.5-1 mg Intravenous Q2H PRN Debbrah Alar, PA-C       ondansetron (ZOFRAN) injection 4 mg  4 mg Intravenous Q4H PRN Debbrah Alar, PA-C       oxyCODONE (Oxy IR/ROXICODONE) immediate release tablet 5 mg  5 mg Oral Q4H PRN Debbrah Alar, PA-C         Objective: Vital: Vitals:   10/21/20 1739 10/21/20 2121 10/22/20 0158 10/22/20 0436  BP: 134/73 129/74 137/77 124/65  Pulse: 63 69 60 69  Resp: '20 19 18 18  '$ Temp: (!) 97.4 F (36.3 C) 97.7 F (36.5 C) 98.2 F (36.8 C) 98.9 F (37.2 C)  TempSrc: Axillary Oral Oral Oral  SpO2: 97% 97% 95% 93%  Weight:      Height:       I/Os: I/O last 3 completed shifts: In: 2200 [I.V.:1800; IV Piggyback:400] Out: 1285 [Urine:785; Blood:500]  Physical Exam:  General: Patient is in no apparent distress Lungs: Normal respiratory effort, chest expands symmetrically. GI: Incisions are c/d/i.  The abdomen is soft and minimally tender without mass. Ext: lower extremities symmetric  Lab  Results: Recent Labs    10/21/20 1620 10/22/20 0505  HGB 13.5 12.4  HCT 40.6 36.7   Recent Labs    10/22/20 0505  NA 138  K 4.3  CL 104  CO2 26  GLUCOSE 171*  BUN 16  CREATININE 0.99  CALCIUM 9.1   No results for input(s): LABPT, INR in the last 72 hours. No results for input(s): LABURIN in the last 72 hours. Results for orders placed or performed in visit on 10/19/20  SARS Coronavirus 2 (TAT 6-24 hrs)     Status: None   Collection Time: 10/19/20 12:00 AM  Result Value Ref Range Status   SARS Coronavirus 2 RESULT: NEGATIVE  Final    Comment: RESULT: NEGATIVESARS-CoV-2 INTERPRETATION:A NEGATIVE  test result means that SARS-CoV-2 RNA was not present in the specimen above the limit of detection of this test. This does not preclude a possible SARS-CoV-2 infection and should not be used as the  sole basis for patient management decisions. Negative results must be combined with clinical observations, patient history, and epidemiological information. Optimum specimen types and timing for peak viral levels during infections caused by SARS-CoV-2  have not been determined. Collection of multiple specimens or types of specimens may be necessary to detect virus. Improper specimen collection and handling, sequence variability under primers/probes, or organism present below the limit of  detection may  lead to false negative results. Positive and negative predictive values of testing are highly dependent on prevalence. False negative test results are more likely when prevalence of disease is high.The expected result is NEGATIVE.Fact S heet for  Healthcare Providers: LocalChronicle.no Sheet for Patients: SalonLookup.es Reference Range - Negative     Studies/Results: No results found.  Assessment: Right renal mass  Procedure(s): RIGHT ROBOTIC PARTIAL NEPHRECTOMY, 1 Day Post-Op  doing well.  Plan: Keep overnight to ensure she  tolerates a regular diet.  Likely discharge tomorrow.   Link Snuffer, MD Urology 10/22/2020, 5:19 PM

## 2020-10-22 NOTE — Progress Notes (Signed)
The patient ambulated 90 feet x2. Activity tolerated moderately well.

## 2020-10-22 NOTE — Progress Notes (Addendum)
Patients diet has been advanced. Pt remained on liquid diet. No nausea vomiting at this time. Pt c/o bloating. Will continue to monitor.

## 2020-10-22 NOTE — Discharge Summary (Addendum)
Physician Discharge Summary  Patient ID: Angela Nielsen MRN: [id number] DOB/AGE: 02-07-1960 60 y.o.  Admit date: 10/21/2020 Discharge date: 10/23/20  Admission Diagnoses:  Discharge Diagnoses:  Active Problems:   Renal mass   Discharged Condition: good  Hospital Course: Status post right robotic partial nephrectomy on 10/21/2020.  Uncomplicated postoperative course.  She did have a malfunctioning Foley catheter last night.  It was removed and now she is voiding clear yellow urine.  Minimal pain.  On postoperative day 1 she had low p.o. intake.  She was therefore kept another night for observation and oral intake improved.  She was having flatus on day 2.  Ambulating well and stable for discharge.  Consults: None  Significant Diagnostic Studies: None  Treatments: surgery: As above  Discharge Exam: Blood pressure 124/65, pulse 69, temperature 98.9 F (37.2 C), temperature source Oral, resp. rate 18, height '5\' 5"'$  (1.651 m), weight 87.2 kg, last menstrual period 08/24/2013, SpO2 93 %. General appearance: alert no acute distress Adequate perfusion of extremities Nonlabored respiration, symmetrical chest rise Abdomen soft, minimally tender, incisions clean dry and intact  Disposition: Discharge disposition: 01-Home or Self Care       Discharge Instructions     No wound care   Complete by: As directed       Allergies as of 10/22/2020   No Known Allergies      Medication List     TAKE these medications    docusate sodium 100 MG capsule Commonly known as: COLACE Take 1 capsule (100 mg total) by mouth 2 (two) times daily.   HYDROcodone-acetaminophen 5-325 MG tablet Commonly known as: Norco Take 1-2 tablets by mouth every 6 (six) hours as needed for moderate pain.   promethazine 12.5 MG tablet Commonly known as: PHENERGAN Take 1 tablet (12.5 mg total) by mouth every 4 (four) hours as needed for nausea or vomiting.   tamsulosin 0.4 MG Caps capsule Commonly known  as: FLOMAX Take 1 capsule (0.4 mg total) by mouth daily.        Follow-up Information     Ceasar Mons, MD Follow up on 11/03/2020.   Specialty: Urology Why: at 2:30 Contact information: [address] [place] [address] [phone]                 Signed: Marton Redwood, III 10/22/2020, 11:34 AM

## 2020-10-22 NOTE — Progress Notes (Signed)
Patient expressed concerns about discharge prior to tolerating a regular diet.Provider updated. Will hold discharge for now.

## 2020-10-23 DIAGNOSIS — C641 Malignant neoplasm of right kidney, except renal pelvis: Secondary | ICD-10-CM | POA: Diagnosis not present

## 2020-10-23 NOTE — Progress Notes (Signed)
Patient discharged home with husband, discharge instructions given and explained to patient/husband, they verbalized understanding, patient denies any pain/distress, surgical incision clean/dry/intact, bruised around the large incision. Accompanied home by husband.

## 2020-10-23 NOTE — Plan of Care (Signed)
  Problem: Education: Goal: Knowledge of General Education information will improve Description: Including pain rating scale, medication(s)/side effects and non-pharmacologic comfort measures Outcome: Progressing   Problem: Activity: Goal: Risk for activity intolerance will decrease Outcome: Progressing   Problem: Nutrition: Goal: Adequate nutrition will be maintained Outcome: Progressing   Problem: Coping: Goal: Level of anxiety will decrease Outcome: Progressing   Problem: Elimination: Goal: Will not experience complications related to urinary retention Outcome: Progressing   Problem: Pain Managment: Goal: General experience of comfort will improve Outcome: Progressing   Problem: Safety: Goal: Ability to remain free from injury will improve Outcome: Progressing   Problem: Skin Integrity: Goal: Risk for impaired skin integrity will decrease Outcome: Progressing   Problem: Education: Goal: Knowledge of the prescribed therapeutic regimen will improve Outcome: Progressing   Problem: Bowel/Gastric: Goal: Gastrointestinal status for postoperative course will improve Outcome: Progressing   Problem: Respiratory: Goal: Ability to achieve and maintain a regular respiratory rate will improve Outcome: Progressing   Problem: Skin Integrity: Goal: Demonstration of wound healing without infection will improve Outcome: Progressing   Problem: Urinary Elimination: Goal: Ability to achieve and maintain urine output will improve Outcome: Progressing

## 2020-10-25 LAB — SURGICAL PATHOLOGY

## 2020-12-06 ENCOUNTER — Other Ambulatory Visit: Payer: Self-pay

## 2020-12-06 ENCOUNTER — Encounter (HOSPITAL_BASED_OUTPATIENT_CLINIC_OR_DEPARTMENT_OTHER): Payer: Self-pay

## 2020-12-06 ENCOUNTER — Ambulatory Visit (HOSPITAL_BASED_OUTPATIENT_CLINIC_OR_DEPARTMENT_OTHER)
Admission: RE | Admit: 2020-12-06 | Discharge: 2020-12-06 | Disposition: A | Discharge status: Home / Self Care | Payer: Commercial Managed Care - PPO | Source: Ambulatory Visit | Attending: Family Medicine | Admitting: Family Medicine

## 2020-12-06 DIAGNOSIS — Z1231 Encounter for screening mammogram for malignant neoplasm of breast: Secondary | ICD-10-CM | POA: Insufficient documentation

## 2020-12-07 ENCOUNTER — Encounter: Payer: Self-pay | Admitting: Family Medicine

## 2020-12-07 DIAGNOSIS — R2230 Localized swelling, mass and lump, unspecified upper limb: Secondary | ICD-10-CM | POA: Insufficient documentation

## 2020-12-07 HISTORY — DX: Localized swelling, mass and lump, unspecified upper limb: R22.30

## 2020-12-08 ENCOUNTER — Other Ambulatory Visit: Payer: Self-pay | Admitting: Family Medicine

## 2020-12-08 DIAGNOSIS — R928 Other abnormal and inconclusive findings on diagnostic imaging of breast: Secondary | ICD-10-CM

## 2020-12-15 ENCOUNTER — Other Ambulatory Visit: Payer: Self-pay | Admitting: Family Medicine

## 2020-12-15 ENCOUNTER — Other Ambulatory Visit: Payer: Self-pay

## 2020-12-15 ENCOUNTER — Ambulatory Visit
Admission: RE | Admit: 2020-12-15 | Discharge: 2020-12-15 | Disposition: A | Discharge status: Home / Self Care | Payer: Commercial Managed Care - PPO | Source: Ambulatory Visit | Attending: Family Medicine | Admitting: Family Medicine

## 2020-12-15 DIAGNOSIS — R928 Other abnormal and inconclusive findings on diagnostic imaging of breast: Secondary | ICD-10-CM

## 2021-01-12 ENCOUNTER — Telehealth: Payer: Self-pay | Admitting: Family Medicine

## 2021-01-12 ENCOUNTER — Ambulatory Visit
Admission: RE | Admit: 2021-01-12 | Discharge: 2021-01-12 | Disposition: A | Discharge status: Home / Self Care | Payer: Commercial Managed Care - PPO | Source: Ambulatory Visit | Attending: Family Medicine | Admitting: Family Medicine

## 2021-01-12 ENCOUNTER — Other Ambulatory Visit (HOSPITAL_COMMUNITY)
Admission: RE | Admit: 2021-01-12 | Discharge: 2021-01-12 | Disposition: A | Discharge status: Home / Self Care | Payer: Commercial Managed Care - PPO | Source: Ambulatory Visit | Attending: General Practice | Admitting: General Practice

## 2021-01-12 ENCOUNTER — Other Ambulatory Visit: Payer: Self-pay | Admitting: Family Medicine

## 2021-01-12 DIAGNOSIS — R928 Other abnormal and inconclusive findings on diagnostic imaging of breast: Secondary | ICD-10-CM

## 2021-01-13 MED ORDER — METFORMIN HCL 500 MG PO TABS: 500.0000 mg | ORAL_TABLET | Freq: Every day | ORAL | 0 refills | Status: DC

## 2021-01-13 NOTE — Addendum Note (Signed)
Addended by: Octaviano Glow on: 01/13/2021 03:00 PM   Modules accepted: Orders

## 2021-01-13 NOTE — Telephone Encounter (Signed)
Spoke with pt regarding medication. In 01/2020 pt stated that she had stopped the medication. Pt stated that she been restarted medication about a month ago. Pt has upcoming appt 01/24/21. Please advise if okay to refill.

## 2021-01-13 NOTE — Telephone Encounter (Signed)
Ok to refill 

## 2021-01-13 NOTE — Telephone Encounter (Signed)
30 d/s sent to pharmacy

## 2021-01-17 LAB — SURGICAL PATHOLOGY

## 2021-01-24 ENCOUNTER — Encounter: Payer: Self-pay | Admitting: Family Medicine

## 2021-01-24 ENCOUNTER — Other Ambulatory Visit: Payer: Self-pay

## 2021-01-24 ENCOUNTER — Ambulatory Visit (INDEPENDENT_AMBULATORY_CARE_PROVIDER_SITE_OTHER): Payer: Commercial Managed Care - PPO | Admitting: Family Medicine

## 2021-01-24 VITALS — BP 113/72 | HR 76 | Temp 97.6°F | Ht 65.0 in | Wt 200.6 lb

## 2021-01-24 DIAGNOSIS — R7303 Prediabetes: Secondary | ICD-10-CM

## 2021-01-24 DIAGNOSIS — E669 Obesity, unspecified: Secondary | ICD-10-CM | POA: Diagnosis not present

## 2021-01-24 DIAGNOSIS — K760 Fatty (change of) liver, not elsewhere classified: Secondary | ICD-10-CM | POA: Diagnosis not present

## 2021-01-24 NOTE — Patient Instructions (Signed)
Great to see you today.  I have refilled the medication(s) we provide.   If labs were collected, we will inform you of lab results once received either by echart message or telephone call.   - echart message- for normal results that have been seen by the patient already.   - telephone call: abnormal results or if patient has not viewed results in their echart.  

## 2021-01-24 NOTE — Progress Notes (Signed)
Patient ID: Angela Nielsen, female  DOB: 17-Jul-1960, 60 y.o.   MRN: [id number] Patient Care Team    Relationship Specialty Notifications Start End  Ma Hillock, DO PCP - General Family Medicine  08/25/19   Eusebio Friendly, MD Referring Physician Internal Medicine  06/15/20    Comment: Gastroenterology    Chief Complaint  Patient presents with   Pre-Diabetes    Currently takes Metformin but has not been taking consistently. Pt is not fasting    Subjective: Angela Nielsen is a 60 y.o.  female present for   Prediabetes: pt has been on metformin in the past. Last a1c 6.2> 6.5  last visit. She has gone through a great deal of stressors since that time. Dx w/ [facility] and had an abnormal Mammogram that required additional views and clip.  Sugar 171 3 months ago with a1c 6.5. Metformin was restarted. She admits she was not always compliant, but has been since October.   Depression screen [facility] 2/9 01/24/2021 01/19/2020 08/25/2019  Decreased Interest 0 0 0  Down, Depressed, Hopeless 0 0 0  PHQ - 2 Score 0 0 0  Altered sleeping - - 1  Tired, decreased energy - - 0  Change in appetite - - 0  Feeling bad or failure about yourself  - - 0  Trouble concentrating - - 0  Moving slowly or fidgety/restless - - 0  Suicidal thoughts - - 0  PHQ-9 Score - - 1  Difficult doing work/chores - - Not difficult at all   No flowsheet data found.     Fall Risk  08/25/2019  Falls in the past year? 0  Number falls in past yr: 0  Injury with Fall? 0   Immunization History  Administered Date(s) Administered   Influenza, Seasonal, Injecte, Preservative Fre 11/05/2017   Influenza,inj,Quad PF,6+ Mos 11/25/2019, 11/21/2020   PFIZER(Purple Top)SARS-COV-2 Vaccination 05/07/2019, 06/08/2019   Pneumococcal Conjugate-13 02/05/2014   Tdap 11/20/2013   Zoster Recombinat (Shingrix) 08/25/2019, 11/25/2019    No results found.  Past Medical History:  Diagnosis Date   Allergies    Chicken pox    Depression     Family history of adverse reaction to anesthesia    aunt difficult to wake up to medication no longer used   Fibrocystic breast disease    History of kidney stones    Kidney stone    Skin cancer    Basal cell    No Known Allergies Past Surgical History:  Procedure Laterality Date   CESAREAN SECTION  1995   COLONOSCOPY  2015   MOHS SURGERY     nose and shoulder    ROBOTIC ASSITED PARTIAL NEPHRECTOMY Right 10/21/2020   Procedure: RIGHT ROBOTIC PARTIAL NEPHRECTOMY;  Surgeon: Ceasar Mons, MD;  Location: [facility];  Service: Urology;  Laterality: Right;   TONSILLECTOMY AND ADENOIDECTOMY  1969   Family History  Problem Relation Age of Onset   Breast cancer Mother        BRCA +   Heart disease Mother    Stroke Mother    Gout Father    COPD Father    Hyperlipidemia Father    Hypertension Brother    Hyperlipidemia Brother    Hypertension Brother    Social History   Social History Narrative   Not on file    Allergies as of 01/24/2021   No Known Allergies      Medication List        Accurate  as of January 24, 2021 11:59 PM. If you have any questions, ask your nurse or doctor.          STOP taking these medications    docusate sodium 100 MG capsule Commonly known as: COLACE Stopped by: Howard Pouch, DO   HYDROcodone-acetaminophen 5-325 MG tablet Commonly known as: Norco Stopped by: Howard Pouch, DO   promethazine 12.5 MG tablet Commonly known as: PHENERGAN Stopped by: Howard Pouch, DO   tamsulosin 0.4 MG Caps capsule Commonly known as: FLOMAX Stopped by: Howard Pouch, DO       TAKE these medications    metFORMIN 500 MG tablet Commonly known as: GLUCOPHAGE Take 1 tablet (500 mg total) by mouth daily with breakfast.        All past medical history, surgical history, allergies, family history, immunizations andmedications were updated in the EMR today and reviewed under the history and medication portions of their EMR.     Patient  was never admitted.   ROS: 14 pt review of systems performed and negative (unless mentioned in an HPI)  Objective: BP 113/72    Pulse 76    Temp 97.6 F (36.4 C) (Oral)    Ht '5\' 5"'  (1.651 m)    Wt 200 lb 9.6 oz (91 kg)    LMP 08/24/2013    SpO2 99%    BMI 33.38 kg/m  Physical Exam Vitals and nursing note reviewed.  Constitutional:      General: She is not in acute distress.    Appearance: Normal appearance. She is obese. She is not ill-appearing, toxic-appearing or diaphoretic.  HENT:     Head: Normocephalic and atraumatic.     Mouth/Throat:     Mouth: Mucous membranes are moist.  Eyes:     General: No scleral icterus.       Right eye: No discharge.        Left eye: No discharge.     Extraocular Movements: Extraocular movements intact.     Conjunctiva/sclera: Conjunctivae normal.     Pupils: Pupils are equal, round, and reactive to light.  Cardiovascular:     Rate and Rhythm: Normal rate and regular rhythm.  Pulmonary:     Effort: Pulmonary effort is normal. No respiratory distress.     Breath sounds: Normal breath sounds. No wheezing, rhonchi or rales.  Musculoskeletal:     Cervical back: Neck supple. No tenderness.  Lymphadenopathy:     Cervical: No cervical adenopathy.  Skin:    General: Skin is warm and dry.     Coloration: Skin is not jaundiced or pale.     Findings: No erythema or rash.  Neurological:     Mental Status: She is alert and oriented to person, place, and time. Mental status is at baseline.     Motor: No weakness.     Gait: Gait normal.  Psychiatric:        Mood and Affect: Mood normal.        Behavior: Behavior normal.        Thought Content: Thought content normal.        Judgment: Judgment normal.      Assessment/plan: Angela Nielsen is a 60 y.o. female present for  Prediabetes/obesity - continue metformin 500 mg QHS (tolerated better at night) - HgB A1C- pt declined POCT and desired blood draw> collected today with bmp. Routine exercise and  dietary modifications recommended.  - f/u 6 mos.     Return in about 24 weeks (around  07/11/2021) for CPE (30 min), CMC (30 min).   Orders Placed This Encounter  Procedures   Hemoglobin T2K   Basic Metabolic Panel (BMET)   No orders of the defined types were placed in this encounter.  Referral Orders  No referral(s) requested today     Note is dictated utilizing voice recognition software. Although note has been proof read prior to signing, occasional typographical errors still can be missed. If any questions arise, please do not hesitate to call for verification.  Electronically signed by: Howard Pouch, DO [place]

## 2021-01-25 ENCOUNTER — Encounter: Payer: Self-pay | Admitting: Family Medicine

## 2021-01-25 ENCOUNTER — Other Ambulatory Visit: Payer: Self-pay

## 2021-01-25 LAB — HEMOGLOBIN A1C
Hgb A1c MFr Bld: 6.4 % of total Hgb — ABNORMAL HIGH (ref <5.7)
Mean Plasma Glucose: 137 mg/dL
eAG (mmol/L): 7.6 mmol/L

## 2021-01-25 LAB — BASIC METABOLIC PANEL
BUN: 18 mg/dL (ref 7–25)
CO2: 24 mmol/L (ref 20–32)
Calcium: 9.3 mg/dL (ref 8.6–10.4)
Chloride: 104 mmol/L (ref 98–110)
Creat: 0.87 mg/dL (ref 0.50–1.03)
Glucose, Bld: 128 mg/dL — ABNORMAL HIGH (ref 65–99)
Potassium: 4.1 mmol/L (ref 3.5–5.3)
Sodium: 140 mmol/L (ref 135–146)

## 2021-01-25 MED ORDER — METFORMIN HCL 500 MG PO TABS: 500.0000 mg | ORAL_TABLET | Freq: Every day | ORAL | 1 refills | Status: DC

## 2021-02-05 HISTORY — PX: BASAL CELL CARCINOMA EXCISION: SHX1214

## 2021-03-27 ENCOUNTER — Ambulatory Visit (HOSPITAL_COMMUNITY)
Admission: RE | Admit: 2021-03-27 | Discharge: 2021-03-27 | Disposition: A | Discharge status: Home / Self Care | Payer: Commercial Managed Care - PPO | Source: Ambulatory Visit | Attending: Urology | Admitting: Urology

## 2021-03-27 ENCOUNTER — Other Ambulatory Visit (HOSPITAL_COMMUNITY): Payer: Self-pay | Admitting: Urology

## 2021-03-27 ENCOUNTER — Other Ambulatory Visit: Payer: Self-pay

## 2021-03-27 DIAGNOSIS — Z85528 Personal history of other malignant neoplasm of kidney: Secondary | ICD-10-CM

## 2021-07-17 ENCOUNTER — Encounter: Payer: Self-pay | Admitting: Family Medicine

## 2021-07-17 ENCOUNTER — Ambulatory Visit (INDEPENDENT_AMBULATORY_CARE_PROVIDER_SITE_OTHER): Payer: Commercial Managed Care - PPO | Admitting: Family Medicine

## 2021-07-17 VITALS — BP 119/76 | HR 87 | Temp 98.1°F | Wt 198.0 lb

## 2021-07-17 DIAGNOSIS — R21 Rash and other nonspecific skin eruption: Secondary | ICD-10-CM

## 2021-07-17 DIAGNOSIS — R7303 Prediabetes: Secondary | ICD-10-CM

## 2021-07-17 DIAGNOSIS — L309 Dermatitis, unspecified: Secondary | ICD-10-CM | POA: Insufficient documentation

## 2021-07-17 LAB — TSH: TSH: 1.43 u[IU]/mL (ref 0.35–5.50)

## 2021-07-17 LAB — HEMOGLOBIN A1C: Hgb A1c MFr Bld: 6.8 % — ABNORMAL HIGH (ref 4.6–6.5)

## 2021-07-17 MED ORDER — METFORMIN HCL 500 MG PO TABS: 500.0000 mg | ORAL_TABLET | Freq: Every day | ORAL | 1 refills | Status: DC

## 2021-07-17 MED ORDER — HYDROXYZINE HCL 10 MG PO TABS: 10.0000 mg | ORAL_TABLET | Freq: Every evening | ORAL | 3 refills | Status: DC

## 2021-07-17 MED ORDER — METHYLPREDNISOLONE ACETATE 80 MG/ML IJ SUSP
80.0000 mg | Freq: Once | INTRAMUSCULAR | Status: AC
  Administered 2021-07-17: 80 mg via INTRAMUSCULAR

## 2021-07-17 NOTE — Patient Instructions (Addendum)
  Start cerave ointment (healing) Xyzal before bed.  Hydroxyzine/vistaril

## 2021-07-17 NOTE — Progress Notes (Signed)
Angela Nielsen , 02/01/1961, 61 y.o., female MRN: [id number] Patient Care Team    Relationship Specialty Notifications Start End  Ma Hillock, DO PCP - General Family Medicine  08/25/19   Eusebio Friendly, MD Referring Physician Internal Medicine  06/15/20    Comment: Gastroenterology    Chief Complaint  Patient presents with   Rash    Going on for a month. Seen derm in may was on prednisone for 10 days. Started on back; moves all over per pt.     Subjective: Pt presents for an OV with complaints of rash of 1 month duration.  She reports she saw dermatology for this condition and they provided her with prednisone taper which did relieve her symptoms.  Unfortunately, as soon as prednisone taper was complete her symptoms returned.  She states that she has severe itchiness with rash.  Rash is all over her body and pops up in single red raised splotches.  She takes Benadryl at night to help with the itchiness.   She denies any changes in cleaning products, beauty products, jewelry or anything new as far as medications are in the household. She endorses more stress that may be contributing. She has never had eczema or psoriasis in the past. No one else in the household is affected or has similar rash. She does not have any pets.     01/24/2021    1:56 PM 01/19/2020    9:57 AM 08/25/2019   10:09 AM  Depression screen PHQ 2/9  Decreased Interest 0 0 0  Down, Depressed, Hopeless 0 0 0  PHQ - 2 Score 0 0 0  Altered sleeping   1  Tired, decreased energy   0  Change in appetite   0  Feeling bad or failure about yourself    0  Trouble concentrating   0  Moving slowly or fidgety/restless   0  Suicidal thoughts   0  PHQ-9 Score   1  Difficult doing work/chores   Not difficult at all    No Known Allergies Social History   Social History Narrative   Not on file   Past Medical History:  Diagnosis Date   Allergies    Chicken pox    Depression    Family history of adverse  reaction to anesthesia    aunt difficult to wake up to medication no longer used   Fibrocystic breast disease    History of kidney stones    Kidney stone    Renal cell carcinoma ([place])    Skin cancer    Basal cell    Past Surgical History:  Procedure Laterality Date   CESAREAN SECTION  1995   COLONOSCOPY  2015   MOHS SURGERY     nose and shoulder    ROBOTIC ASSITED PARTIAL NEPHRECTOMY Right 10/21/2020   Procedure: RIGHT ROBOTIC PARTIAL NEPHRECTOMY;  Surgeon: Ceasar Mons, MD;  Location: [facility];  Service: Urology;  Laterality: Right;   TONSILLECTOMY AND ADENOIDECTOMY  1969   Family History  Problem Relation Age of Onset   Breast cancer Mother        BRCA +   Heart disease Mother    Stroke Mother    Gout Father    COPD Father    Hyperlipidemia Father    Hypertension Brother    Hyperlipidemia Brother    Hypertension Brother    Allergies as of 07/17/2021   No Known Allergies      Medication  List        Accurate as of July 17, 2021  2:57 PM. If you have any questions, ask your nurse or doctor.          BENADRYL ALLERGY PO Take by mouth as needed.   hydrOXYzine 10 MG tablet Commonly known as: ATARAX Take 1-5 tablets (10-50 mg total) by mouth at bedtime. Started by: Howard Pouch, DO   metFORMIN 500 MG tablet Commonly known as: GLUCOPHAGE Take 1 tablet (500 mg total) by mouth daily with breakfast.   phentermine 37.5 MG capsule Take 37.5 mg by mouth every morning.   triamcinolone cream 0.1 % Commonly known as: KENALOG Apply 1 application  topically 2 (two) times daily.   ZYRTEC ALLERGY PO Take by mouth.        All past medical history, surgical history, allergies, family history, immunizations andmedications were updated in the EMR today and reviewed under the history and medication portions of their EMR.     ROS Negative, with the exception of above mentioned in HPI   Objective:  BP 119/76   Pulse 87   Temp 98.1 F (36.7 C)   Wt  198 lb (89.8 kg)   LMP 08/24/2013   BMI 32.95 kg/m  Body mass index is 32.95 kg/m. Physical Exam Vitals and nursing note reviewed.  Constitutional:      General: She is not in acute distress.    Appearance: Normal appearance. She is normal weight. She is not ill-appearing or toxic-appearing.  Eyes:     Extraocular Movements: Extraocular movements intact.     Conjunctiva/sclera: Conjunctivae normal.     Pupils: Pupils are equal, round, and reactive to light.  Skin:    Findings: Rash present.     Comments: Red small singular areas over arms back abdomen and legs.  Spares face.  Many excoriated.  No cellulitis or drainage.  Neurological:     Mental Status: She is alert and oriented to person, place, and time. Mental status is at baseline.  Psychiatric:        Mood and Affect: Mood normal.        Behavior: Behavior normal.        Thought Content: Thought content normal.        Judgment: Judgment normal.     No results found. No results found. No results found for this or any previous visit (from the past 24 hour(s)).  Assessment/Plan: CHAN ROSASCO is a 61 y.o. female present for OV for  Rash/dermatitis Uncertain etiology.  Her dermatitis is not consistent with the normal pattern of dermatitis, but it did respond to prednisone from dermatology. Encouraged patient to start Xyzal before bed Vistaril 10-50 mg nightly, can take during the day at lower dose if needed.  Sedation precautions discussed. Rule out thyroid disorder causing sensitivity CeraVe ointment after showers encouraged. - TSH - methylPREDNISolone acetate (DEPO-MEDROL) injection 80 mg Follow-up dependent upon clinical outcome  Prediabetes Continue metformin 500 mg with breakfast. - Hemoglobin A1c 5.15-monthfollow-up  Reviewed expectations re: course of current medical issues. Discussed self-management of symptoms. Outlined signs and symptoms indicating need for more acute intervention. Patient verbalized  understanding and all questions were answered. Patient received an After-Visit Summary.    Orders Placed This Encounter  Procedures   TSH   Hemoglobin A1c   Meds ordered this encounter  Medications   hydrOXYzine (ATARAX) 10 MG tablet    Sig: Take 1-5 tablets (10-50 mg total) by mouth at bedtime.  Dispense:  120 tablet    Refill:  3   metFORMIN (GLUCOPHAGE) 500 MG tablet    Sig: Take 1 tablet (500 mg total) by mouth daily with breakfast.    Dispense:  90 tablet    Refill:  1   methylPREDNISolone acetate (DEPO-MEDROL) injection 80 mg   Referral Orders  No referral(s) requested today     Note is dictated utilizing voice recognition software. Although note has been proof read prior to signing, occasional typographical errors still can be missed. If any questions arise, please do not hesitate to call for verification.   electronically signed by:  Howard Pouch, DO  [place]

## 2021-07-18 ENCOUNTER — Telehealth: Payer: Self-pay | Admitting: Family Medicine

## 2021-07-18 NOTE — Telephone Encounter (Signed)
Please call patient Thyroid function is normal. Her A1c went up a small margin from 6.4 to 6.8. I encouraged her to continue the metformin at current dose.  Increase her exercise.  Follow a diabetic diet low in sugar and complex carbs.

## 2021-07-18 NOTE — Telephone Encounter (Signed)
Spoke with pt regarding labs and instructions.   

## 2021-07-26 ENCOUNTER — Encounter: Payer: Self-pay | Admitting: Family Medicine

## 2021-07-26 ENCOUNTER — Ambulatory Visit (INDEPENDENT_AMBULATORY_CARE_PROVIDER_SITE_OTHER): Payer: Commercial Managed Care - PPO | Admitting: Family Medicine

## 2021-07-26 VITALS — BP 120/79 | HR 88 | Temp 97.7°F | Ht 65.0 in

## 2021-07-26 DIAGNOSIS — L600 Ingrowing nail: Secondary | ICD-10-CM

## 2021-07-26 MED ORDER — MUPIROCIN 2 % EX OINT: 1.0000 | TOPICAL_OINTMENT | Freq: Three times a day (TID) | CUTANEOUS | 0 refills | Status: DC

## 2021-07-26 MED ORDER — FLUOCINONIDE 0.05 % EX OINT: 1.0000 | TOPICAL_OINTMENT | Freq: Two times a day (BID) | CUTANEOUS | 0 refills | Status: DC

## 2021-07-26 MED ORDER — DOXYCYCLINE HYCLATE 100 MG PO TABS: 100.0000 mg | ORAL_TABLET | Freq: Two times a day (BID) | ORAL | 0 refills | Status: DC

## 2021-07-26 NOTE — Progress Notes (Signed)
MAKALEY Nielsen , [birth year], 61 y.o., female MRN: [id number] Patient Care Team    Relationship Specialty Notifications Start End  Ma Hillock, DO PCP - General Family Medicine  08/25/19   Eusebio Friendly, MD Referring Physician Internal Medicine  06/15/20    Comment: Gastroenterology    Chief Complaint  Patient presents with   Toe Pain    Pt c/o L great toe pain and swelling x 3 weeks     Subjective: Angela Nielsen is a 61 y.o. female present for Left great toe pain and swelling of 3 weeks duration.  Patient reports she thought she had a mild fungal infection underneath her left great toenail and has been using tea tree oil soaks.  It had been improving and nail growth appears normal at the base of nail.  Over the last 3 weeks she has noticed swelling along the medial nail fold.  She did appreciate pus drainage once last week.  She reports it is rather tender.   Of note, she states that her rash is improving greatly. Prior note Pt presents for an OV with complaints of rash of 1 month duration.  She reports she saw dermatology for this condition and they provided her with prednisone taper which did relieve her symptoms.  Unfortunately, as soon as prednisone taper was complete her symptoms returned.  She states that she has severe itchiness with rash.  Rash is all over her body and pops up in single red raised splotches.  She takes Benadryl at night to help with the itchiness.   She denies any changes in cleaning products, beauty products, jewelry or anything new as far as medications are in the household. She endorses more stress that may be contributing. She has never had eczema or psoriasis in the past. No one else in the household is affected or has similar rash. She does not have any pets.     01/24/2021    1:56 PM 01/19/2020    9:57 AM 08/25/2019   10:09 AM  Depression screen PHQ 2/9  Decreased Interest 0 0 0  Down, Depressed, Hopeless 0 0 0  PHQ - 2 Score 0 0 0   Altered sleeping   1  Tired, decreased energy   0  Change in appetite   0  Feeling bad or failure about yourself    0  Trouble concentrating   0  Moving slowly or fidgety/restless   0  Suicidal thoughts   0  PHQ-9 Score   1  Difficult doing work/chores   Not difficult at all    No Known Allergies Social History   Social History Narrative   Not on file   Past Medical History:  Diagnosis Date   Allergies    Chicken pox    Depression    Family history of adverse reaction to anesthesia    aunt difficult to wake up to medication no longer used   Fibrocystic breast disease    History of kidney stones    Kidney stone    Renal cell carcinoma ([place])    Skin cancer    Basal cell    Past Surgical History:  Procedure Laterality Date   CESAREAN SECTION  1995   COLONOSCOPY  2015   MOHS SURGERY     nose and shoulder    ROBOTIC ASSITED PARTIAL NEPHRECTOMY Right 10/21/2020   Procedure: RIGHT ROBOTIC PARTIAL NEPHRECTOMY;  Surgeon: Ceasar Mons, MD;  Location: [facility];  Service:  Urology;  Laterality: Right;   TONSILLECTOMY AND ADENOIDECTOMY  1969   Family History  Problem Relation Age of Onset   Breast cancer Mother        BRCA +   Heart disease Mother    Stroke Mother    Gout Father    COPD Father    Hyperlipidemia Father    Hypertension Brother    Hyperlipidemia Brother    Hypertension Brother    Allergies as of 07/26/2021   No Known Allergies      Medication List        Accurate as of July 26, 2021  4:30 PM. If you have any questions, ask your nurse or doctor.          STOP taking these medications    triamcinolone cream 0.1 % Commonly known as: KENALOG Stopped by: Howard Pouch, DO       TAKE these medications    BENADRYL ALLERGY PO Take by mouth as needed.   doxycycline 100 MG tablet Commonly known as: VIBRA-TABS Take 1 tablet (100 mg total) by mouth 2 (two) times daily. Started by: Howard Pouch, DO   fluocinonide ointment 0.05  % Commonly known as: LIDEX Apply 1 Application topically 2 (two) times daily. Started by: Howard Pouch, DO   hydrOXYzine 10 MG tablet Commonly known as: ATARAX Take 1-5 tablets (10-50 mg total) by mouth at bedtime.   metFORMIN 500 MG tablet Commonly known as: GLUCOPHAGE Take 1 tablet (500 mg total) by mouth daily with breakfast.   mupirocin ointment 2 % Commonly known as: BACTROBAN Apply 1 Application topically 3 (three) times daily. Started by: Howard Pouch, DO   phentermine 37.5 MG capsule Take 37.5 mg by mouth every morning.   ZYRTEC ALLERGY PO Take by mouth.        All past medical history, surgical history, allergies, family history, immunizations andmedications were updated in the EMR today and reviewed under the history and medication portions of their EMR.     ROS Negative, with the exception of above mentioned in HPI   Objective:  BP 120/79   Pulse 88   Temp 97.7 F (36.5 C) (Oral)   Ht '5\' 5"'  (1.651 m)   LMP 08/24/2013   SpO2 96%   BMI 32.95 kg/m  Body mass index is 32.95 kg/m. Physical Exam Vitals and nursing note reviewed.  Constitutional:      General: She is not in acute distress.    Appearance: Normal appearance. She is normal weight. She is not ill-appearing or toxic-appearing.  Eyes:     Extraocular Movements: Extraocular movements intact.     Conjunctiva/sclera: Conjunctivae normal.     Pupils: Pupils are equal, round, and reactive to light.  Musculoskeletal:        General: Swelling and tenderness present.     Comments: No drainage or bleeding left great toe  Neurological:     Mental Status: She is alert and oriented to person, place, and time. Mental status is at baseline.  Psychiatric:        Mood and Affect: Mood normal.        Behavior: Behavior normal.        Thought Content: Thought content normal.        Judgment: Judgment normal.      No results found. No results found. No results found for this or any previous visit  (from the past 24 hour(s)).  Assessment/Plan: [name] is a 61 y.o. female present for  OV for  Ingrown toenail: Epson salt soaks twice daily and warm water for 15 to 20 minutes. Doxycycline twice daily x7 days Bactroban twice daily-3 times daily x7 days Steroid cream twice daily x14 days Avoid any pressure on the toe, wear open toed shoes if possible Follow-up as needed Reviewed expectations re: course of current medical issues. Discussed self-management of symptoms. Outlined signs and symptoms indicating need for more acute intervention. Patient verbalized understanding and all questions were answered. Patient received an After-Visit Summary.    No orders of the defined types were placed in this encounter.  Meds ordered this encounter  Medications   doxycycline (VIBRA-TABS) 100 MG tablet    Sig: Take 1 tablet (100 mg total) by mouth 2 (two) times daily.    Dispense:  14 tablet    Refill:  0   mupirocin ointment (BACTROBAN) 2 %    Sig: Apply 1 Application topically 3 (three) times daily.    Dispense:  15 g    Refill:  0   fluocinonide ointment (LIDEX) 0.05 %    Sig: Apply 1 Application topically 2 (two) times daily.    Dispense:  30 g    Refill:  0   Referral Orders  No referral(s) requested today     Note is dictated utilizing voice recognition software. Although note has been proof read prior to signing, occasional typographical errors still can be missed. If any questions arise, please do not hesitate to call for verification.   electronically signed by:  Howard Pouch, DO  [place]

## 2021-07-26 NOTE — Patient Instructions (Signed)
Ingrown Toenail  An ingrown toenail occurs when the corner or sides of a toenail grow into the surrounding skin. This causes discomfort and pain. The big toe is most commonly affected, but any of the toes can be affected. If an ingrown toenail is not treated, it can become infected. What are the causes? This condition may be caused by: Wearing shoes that are too small or tight. An injury, such as stubbing your toe or having your toe stepped on. Improper cutting or care of your toenails. Having nail or foot abnormalities that were present from birth (congenital abnormalities), such as having a nail that is too big for your toe. What increases the risk? The following factors may make you more likely to develop ingrown toenails: Age. Nails tend to get thicker with age, so ingrown nails are more common among older people. Cutting your toenails incorrectly, such as cutting them very short or cutting them unevenly. An ingrown toenail is more likely to get infected if you have: Diabetes. Blood flow (circulation) problems. What are the signs or symptoms? Symptoms of an ingrown toenail may include: Pain, soreness, or tenderness. Redness. Swelling. Hardening of the skin that surrounds the toenail. Signs that an ingrown toenail may be infected include: Fluid or pus. Symptoms that get worse. How is this diagnosed? Ingrown toenails may be diagnosed based on: Your symptoms and medical history. A physical exam. Labs or tests. If you have fluid or blood coming from your toenail, a sample may be collected to test for the specific type of bacteria that is causing the infection. How is this treated? Treatment depends on the severity of your symptoms. You may be able to care for your toenail at home. If you have an infection, you may be prescribed antibiotic medicines. If you have fluid or pus draining from your toenail, your health care provider may drain it. If you have trouble walking, you may be  given crutches to use. If you have a severe or infected ingrown toenail, you may need a procedure to remove part or all of the nail. Follow these instructions at home: Foot care  Check your wound every day for signs of infection, or as often as told by your health care provider. Check for: More redness, swelling, or pain. More fluid or blood. Warmth. Pus or a bad smell. Do not pick at your toenail or try to remove it yourself. Soak your foot in warm, soapy water. Do this for 20 minutes, 3 times a day, or as often as told by your health care provider. This helps to keep your toe clean and your skin soft. Wear shoes that fit well and are not too tight. Your health care provider may recommend that you wear open-toed shoes while you heal. Trim your toenails regularly and carefully. Cut your toenails straight across to prevent injury to the skin at the corners of the toenail. Do not cut your nails in a curved shape. Keep your feet clean and dry to help prevent infection. General instructions Take over-the-counter and prescription medicines only as told by your health care provider. If you were prescribed an antibiotic, take it as told by your health care provider. Do not stop taking the antibiotic even if you start to feel better. If your health care provider told you to use crutches to help you move around, use them as instructed. Return to your normal activities as told by your health care provider. Ask your health care provider what activities are safe for you.   Keep all follow-up visits. This is important. Contact a health care provider if: You have more redness, swelling, pain, or other symptoms that do not improve with treatment. You have fluid, blood, or pus coming from your toenail. You have a red streak on your skin that starts at your foot and spreads up your leg. You have a fever. Summary An ingrown toenail occurs when the corner or sides of a toenail grow into the surrounding skin.  This causes discomfort and pain. The big toe is most commonly affected, but any of the toes can be affected. If an ingrown toenail is not treated, it can become infected. Fluid or pus draining from your toenail is a sign of infection. Your health care provider may need to drain it. You may be given antibiotics to treat the infection. Trimming your toenails regularly and properly can help you prevent an ingrown toenail. This information is not intended to replace advice given to you by your health care provider. Make sure you discuss any questions you have with your health care provider. Document Revised: 05/24/2020 Document Reviewed: 05/24/2020 Elsevier Patient Education  2023 [employer]  

## 2021-10-06 NOTE — Patient Instructions (Addendum)
DUE TO COVID-19 ONLY TWO VISITORS  (aged 59 and older)  ARE ALLOWED TO COME WITH YOU AND STAY IN THE WAITING ROOM ONLY DURING PRE OP AND PROCEDURE.   **NO VISITORS ARE ALLOWED IN THE SHORT STAY AREA OR RECOVERY ROOM!!**  IF YOU WILL BE ADMITTED INTO THE HOSPITAL YOU ARE ALLOWED ONLY FOUR SUPPORT PEOPLE DURING VISITATION HOURS ONLY (7 AM -8PM)   The support person(s) must pass our screening, gel in and out, and wear a mask at all times, including in the patient's room. Patients must also wear a mask when staff or their support person are in the room. Visitors GUEST BADGE MUST BE WORN VISIBLY  One adult visitor may remain with you overnight and MUST be in the room by 8 P.M.     Your procedure is scheduled on: 10/23/21   Report to [facility] Main Entrance    Report to admitting at  10:30AM   Call this number if you have problems the morning of surgery [phone]   Do not eat food :After Midnight.   After Midnight you may have the following liquids until _6:30_____ AM/ DAY OF SURGERY  Water Black Coffee (sugar ok, NO MILK/CREAM OR CREAMERS)  Tea (sugar ok, NO MILK/CREAM OR CREAMERS) regular and decaf                             Plain Jell-O (NO RED)                                           Fruit ices (not with fruit pulp, NO RED)                                     Popsicles (NO RED)                                                                  Juice: apple, WHITE grape, WHITE cranberry Sports drinks like Gatorade (NO RED)                         If you have questions, please contact your surgeon's office.     Oral Hygiene is also important to reduce your risk of infection.                                    Remember - BRUSH YOUR TEETH THE MORNING OF SURGERY WITH YOUR REGULAR TOOTHPASTE   Do NOT smoke after Midnight   Take these medicines the morning of surgery with A SIP OF WATER: Zyrtec  DO NOT TAKE ANY ORAL DIABETIC MEDICATIONS DAY OF YOUR  SURGERY(Metformin)  Bring CPAP mask and tubing day of surgery.                              You may not have any metal on your body including hair pins, jewelry,  and body piercing             Do not wear make-up, lotions, powders, perfumes/cologne, or deodorant  Do not wear nail polish including gel and S&S, artificial/acrylic nails, or any other type of covering on natural nails including finger and toenails. If you have artificial nails, gel coating, etc. that needs to be removed by a nail salon please have this removed prior to surgery or surgery may need to be canceled/ delayed if the surgeon/ anesthesia feels like they are unable to be safely monitored.   Do not shave  48 hours prior to surgery.     Do not bring valuables to the hospital. [place].   Contacts, dentures or bridgework may not be worn into surgery.   DO NOT [place].    Patients discharged on the day of surgery will not be allowed to drive home.  Someone NEEDS to stay with you for the first 24 hours after anesthesia.   Special Instructions: Bring a copy of your healthcare power of attorney and living will documents the day of surgery if you haven't scanned them before.              Please read over the following fact sheets you were given: IF YOU HAVE QUESTIONS ABOUT YOUR PRE-OP INSTRUCTIONS PLEASE CALL [phone]  [facility] - Preparing for Surgery Before surgery, you can play an important role.  Because skin is not sterile, your skin needs to be as free of germs as possible.  You can reduce the number of germs on your skin by washing with CHG (chlorahexidine gluconate) soap before surgery.  CHG is an antiseptic cleaner which kills germs and bonds with the skin to continue killing germs even after washing. Please DO NOT use if you have an allergy to CHG or antibacterial soaps.  If your skin becomes reddened/irritated stop using the  CHG and inform your nurse when you arrive at Short Stay. Do not shave (including legs and underarms) for at least 48 hours prior to the first CHG shower.   Please follow these instructions carefully:  1.  Shower with CHG Soap the night before surgery and the  morning of Surgery.  2.  If you choose to wash your hair, wash your hair first as usual with your  normal  shampoo.  3.  After you shampoo, rinse your hair and body thoroughly to remove the  shampoo.                            4.  Use CHG as you would any other liquid soap.  You can apply chg directly  to the skin and wash                       Gently with a scrungie or clean washcloth.  5.  Apply the CHG Soap to your body ONLY FROM THE NECK DOWN.   Do not use on face/ open                           Wound or open sores. Avoid contact with eyes, ears mouth and genitals (private parts).  Wash face,  Genitals (private parts) with your normal soap.             6.  Wash thoroughly, paying special attention to the area where your surgery  will be performed.  7.  Thoroughly rinse your body with warm water from the neck down.  8.  DO NOT shower/wash with your normal soap after using and rinsing off  the CHG Soap.                9.  Pat yourself dry with a clean towel.            10.  Wear clean pajamas.            11.  Place clean sheets on your bed the night of your first shower and do not  sleep with pets. Day of Surgery : Do not apply any lotions/deodorants the morning of surgery.  Please wear clean clothes to the hospital/surgery center.  FAILURE TO FOLLOW THESE INSTRUCTIONS MAY RESULT IN THE CANCELLATION OF YOUR SURGERY   ________________________________________________________________________      Incentive Spirometer  An incentive spirometer is a tool that can help keep your lungs clear and active. This tool measures how well you are filling your lungs with each breath. Taking long deep breaths may help reverse or  decrease the chance of developing breathing (pulmonary) problems (especially infection) following: A long period of time when you are unable to move or be active. BEFORE THE PROCEDURE  If the spirometer includes an indicator to show your best effort, your nurse or respiratory therapist will set it to a desired goal. If possible, sit up straight or lean slightly forward. Try not to slouch. Hold the incentive spirometer in an upright position. INSTRUCTIONS FOR USE  Sit on the edge of your bed if possible, or sit up as far as you can in bed or on a chair. Hold the incentive spirometer in an upright position. Breathe out normally. Place the mouthpiece in your mouth and seal your lips tightly around it. Breathe in slowly and as deeply as possible, raising the piston or the ball toward the top of the column. Hold your breath for 3-5 seconds or for as long as possible. Allow the piston or ball to fall to the bottom of the column. Remove the mouthpiece from your mouth and breathe out normally. Rest for a few seconds and repeat Steps 1 through 7 at least 10 times every 1-2 hours when you are awake. Take your time and take a few normal breaths between deep breaths. The spirometer may include an indicator to show your best effort. Use the indicator as a goal to work toward during each repetition. After each set of 10 deep breaths, practice coughing to be sure your lungs are clear. If you have an incision (the cut made at the time of surgery), support your incision when coughing by placing a pillow or rolled up towels firmly against it. Once you are able to get out of bed, walk around indoors and cough well. You may stop using the incentive spirometer when instructed by your caregiver.  RISKS AND COMPLICATIONS Take your time so you do not get dizzy or light-headed. If you are in pain, you may need to take or ask for pain medication before doing incentive spirometry. It is harder to take a deep breath if you  are having pain. AFTER USE Rest and breathe slowly and easily. It can be helpful to keep track of  a log of your progress. Your caregiver can provide you with a simple table to help with this. If you are using the spirometer at home, follow these instructions: [place] IF:  You are having difficultly using the spirometer. You have trouble using the spirometer as often as instructed. Your pain medication is not giving enough relief while using the spirometer. You develop fever of 100.5 F (38.1 C) or higher. SEEK IMMEDIATE MEDICAL CARE IF:  You cough up bloody sputum that had not been present before. You develop fever of 102 F (38.9 C) or greater. You develop worsening pain at or near the incision site. MAKE SURE YOU:  Understand these instructions. Will watch your condition. Will get help right away if you are not doing well or get worse. Document Released: 06/04/2006 Document Revised: 04/16/2011 Document Reviewed: 08/05/2006 [facility] Patient Information 2014 [place], [place].   ________________________________________________________________________

## 2021-10-11 NOTE — Progress Notes (Signed)
Sent message, via epic in basket, requesting orders in epic from surgeon.  

## 2021-10-16 ENCOUNTER — Ambulatory Visit: Payer: Self-pay | Admitting: Surgery

## 2021-10-16 DIAGNOSIS — Z01818 Encounter for other preprocedural examination: Secondary | ICD-10-CM

## 2021-10-18 ENCOUNTER — Encounter (HOSPITAL_COMMUNITY): Payer: Self-pay

## 2021-10-18 ENCOUNTER — Other Ambulatory Visit: Payer: Self-pay

## 2021-10-18 ENCOUNTER — Encounter (HOSPITAL_COMMUNITY)
Admission: RE | Admit: 2021-10-18 | Discharge: 2021-10-18 | Disposition: A | Discharge status: Home / Self Care | Payer: Commercial Managed Care - PPO | Source: Ambulatory Visit | Attending: Surgery | Admitting: Surgery

## 2021-10-18 DIAGNOSIS — Z01818 Encounter for other preprocedural examination: Secondary | ICD-10-CM | POA: Diagnosis present

## 2021-10-18 DIAGNOSIS — R7303 Prediabetes: Secondary | ICD-10-CM | POA: Diagnosis not present

## 2021-10-18 LAB — COMPREHENSIVE METABOLIC PANEL
ALT: 48 U/L — ABNORMAL HIGH (ref 0–44)
AST: 36 U/L (ref 15–41)
Albumin: 4.2 g/dL (ref 3.5–5.0)
Alkaline Phosphatase: 74 U/L (ref 38–126)
Anion gap: 7 (ref 5–15)
BUN: 19 mg/dL (ref 6–20)
CO2: 26 mmol/L (ref 22–32)
Calcium: 9.9 mg/dL (ref 8.9–10.3)
Chloride: 106 mmol/L (ref 98–111)
Creatinine, Ser: 0.92 mg/dL (ref 0.44–1.00)
GFR, Estimated: >60 mL/min (ref >60)
Glucose, Bld: 105 mg/dL — ABNORMAL HIGH (ref 70–99)
Potassium: 4.2 mmol/L (ref 3.5–5.1)
Sodium: 139 mmol/L (ref 135–145)
Total Bilirubin: 0.8 mg/dL (ref 0.3–1.2)
Total Protein: 7.6 g/dL (ref 6.5–8.1)

## 2021-10-18 LAB — CBC WITH DIFFERENTIAL/PLATELET
Abs Immature Granulocytes: 0.04 10*3/uL (ref 0.00–0.07)
Basophils Absolute: 0.1 10*3/uL (ref 0.0–0.1)
Basophils Relative: 1 %
Eosinophils Absolute: 0.3 10*3/uL (ref 0.0–0.5)
Eosinophils Relative: 2 %
HCT: 45 % (ref 36.0–46.0)
Hemoglobin: 14.6 g/dL (ref 12.0–15.0)
Immature Granulocytes: 0 %
Lymphocytes Relative: 39 %
Lymphs Abs: 4.9 10*3/uL — ABNORMAL HIGH (ref 0.7–4.0)
MCH: 29.7 pg (ref 26.0–34.0)
MCHC: 32.4 g/dL (ref 30.0–36.0)
MCV: 91.6 fL (ref 80.0–100.0)
Monocytes Absolute: 0.6 10*3/uL (ref 0.1–1.0)
Monocytes Relative: 5 %
Neutro Abs: 6.7 10*3/uL (ref 1.7–7.7)
Neutrophils Relative %: 53 %
Platelets: 333 10*3/uL (ref 150–400)
RBC: 4.91 MIL/uL (ref 3.87–5.11)
RDW: 12.6 % (ref 11.5–15.5)
WBC: 12.7 10*3/uL — ABNORMAL HIGH (ref 4.0–10.5)
nRBC: 0 % (ref 0.0–0.2)

## 2021-10-18 LAB — GLUCOSE, CAPILLARY: Glucose-Capillary: 107 mg/dL — ABNORMAL HIGH (ref 70–99)

## 2021-10-18 LAB — HEMOGLOBIN A1C
Hgb A1c MFr Bld: 6.1 % — ABNORMAL HIGH (ref 4.8–5.6)
Mean Plasma Glucose: 128.37 mg/dL

## 2021-10-18 NOTE — Progress Notes (Signed)
Anesthesia note:  Bowel prep reminder:NA  PCP - Howard Pouch DO Cardiologist -none Other-   Chest x-ray - 03/27/21-epic EKG - 10/18/21-chart Stress Test - no ECHO - no Cardiac Cath - NA  Pacemaker/ICD device last checked:NA  Sleep Study - no CPAP -   Pt is pre diabetic-yes CBG was 106. Pt takes metformin and Ozempic but denies DM Fasting Blood Sugar -  Checks Blood Sugar _____  Blood Thinner:NA Blood Thinner Instructions: Aspirin Instructions: Last Dose:  Anesthesia review:   Patient denies shortness of breath, fever, cough and chest pain at PAT appointment Pt has no SOB with any activities. She had some bad experiences last year with he partial nephrectomy.  Patient verbalized understanding of instructions that were given to them at the PAT appointment. Patient was also instructed that they will need to review over the PAT instructions again at home before surgery. yes

## 2021-10-23 ENCOUNTER — Encounter (HOSPITAL_COMMUNITY): Payer: Self-pay | Admitting: Surgery

## 2021-10-23 ENCOUNTER — Encounter (HOSPITAL_COMMUNITY)
Admission: RE | Disposition: A | Discharge status: Home / Self Care | Payer: Self-pay | Source: Home / Self Care | Attending: Surgery

## 2021-10-23 ENCOUNTER — Other Ambulatory Visit: Payer: Self-pay

## 2021-10-23 ENCOUNTER — Ambulatory Visit (HOSPITAL_COMMUNITY): Payer: Commercial Managed Care - PPO | Admitting: Certified Registered Nurse Anesthetist

## 2021-10-23 ENCOUNTER — Ambulatory Visit (HOSPITAL_COMMUNITY)
Admission: RE | Admit: 2021-10-23 | Discharge: 2021-10-23 | Disposition: A | Discharge status: Home / Self Care | Payer: Commercial Managed Care - PPO | Attending: Surgery | Admitting: Surgery

## 2021-10-23 ENCOUNTER — Ambulatory Visit (HOSPITAL_BASED_OUTPATIENT_CLINIC_OR_DEPARTMENT_OTHER): Payer: Commercial Managed Care - PPO | Admitting: Certified Registered Nurse Anesthetist

## 2021-10-23 DIAGNOSIS — K432 Incisional hernia without obstruction or gangrene: Secondary | ICD-10-CM | POA: Insufficient documentation

## 2021-10-23 DIAGNOSIS — Z905 Acquired absence of kidney: Secondary | ICD-10-CM | POA: Insufficient documentation

## 2021-10-23 DIAGNOSIS — R7303 Prediabetes: Secondary | ICD-10-CM | POA: Diagnosis not present

## 2021-10-23 DIAGNOSIS — K760 Fatty (change of) liver, not elsewhere classified: Secondary | ICD-10-CM | POA: Insufficient documentation

## 2021-10-23 DIAGNOSIS — Z6831 Body mass index (BMI) 31.0-31.9, adult: Secondary | ICD-10-CM | POA: Insufficient documentation

## 2021-10-23 DIAGNOSIS — E669 Obesity, unspecified: Secondary | ICD-10-CM | POA: Diagnosis not present

## 2021-10-23 HISTORY — PX: INCISIONAL HERNIA REPAIR: SHX193

## 2021-10-23 SURGERY — REPAIR, HERNIA, INCISIONAL, LAPAROSCOPIC
Anesthesia: General

## 2021-10-23 MED ORDER — HYDROMORPHONE HCL 2 MG/ML IJ SOLN
INTRAMUSCULAR | Status: AC
  Filled 2021-10-23: qty 1

## 2021-10-23 MED ORDER — ONDANSETRON HCL 4 MG/2ML IJ SOLN
INTRAMUSCULAR | Status: AC
  Filled 2021-10-23: qty 2

## 2021-10-23 MED ORDER — CHLORHEXIDINE GLUCONATE CLOTH 2 % EX PADS: 6.0000 | MEDICATED_PAD | Freq: Once | CUTANEOUS | Status: DC

## 2021-10-23 MED ORDER — ACETAMINOPHEN 500 MG PO TABS
1000.0000 mg | ORAL_TABLET | ORAL | Status: AC
  Administered 2021-10-23: 1000 mg via ORAL
  Filled 2021-10-23: qty 2

## 2021-10-23 MED ORDER — BUPIVACAINE-EPINEPHRINE (PF) 0.25% -1:200000 IJ SOLN
INTRAMUSCULAR | Status: AC
  Filled 2021-10-23: qty 30

## 2021-10-23 MED ORDER — ORAL CARE MOUTH RINSE
15.0000 mL | Freq: Once | OROMUCOSAL | Status: AC
Start: 2021-10-23 — End: 2021-10-23

## 2021-10-23 MED ORDER — OXYCODONE HCL 5 MG PO TABS: 5.0000 mg | ORAL_TABLET | Freq: Three times a day (TID) | ORAL | 0 refills | Status: AC | PRN

## 2021-10-23 MED ORDER — PROPOFOL 10 MG/ML IV BOLUS
INTRAVENOUS | Status: DC | PRN
  Administered 2021-10-23: 150 mg via INTRAVENOUS

## 2021-10-23 MED ORDER — PROPOFOL 10 MG/ML IV BOLUS
INTRAVENOUS | Status: AC
  Filled 2021-10-23: qty 20

## 2021-10-23 MED ORDER — FENTANYL CITRATE (PF) 250 MCG/5ML IJ SOLN
INTRAMUSCULAR | Status: AC
  Filled 2021-10-23: qty 5

## 2021-10-23 MED ORDER — SCOPOLAMINE 1 MG/3DAYS TD PT72
1.0000 | MEDICATED_PATCH | TRANSDERMAL | Status: DC
  Administered 2021-10-23: 1.5 mg via TRANSDERMAL
  Filled 2021-10-23: qty 1

## 2021-10-23 MED ORDER — DEXAMETHASONE SODIUM PHOSPHATE 10 MG/ML IJ SOLN
INTRAMUSCULAR | Status: AC
  Filled 2021-10-23: qty 1

## 2021-10-23 MED ORDER — PHENYLEPHRINE 80 MCG/ML (10ML) SYRINGE FOR IV PUSH (FOR BLOOD PRESSURE SUPPORT)
PREFILLED_SYRINGE | INTRAVENOUS | Status: DC | PRN
  Administered 2021-10-23 (×2): 80 ug via INTRAVENOUS

## 2021-10-23 MED ORDER — CEFAZOLIN SODIUM-DEXTROSE 2-4 GM/100ML-% IV SOLN
2.0000 g | INTRAVENOUS | Status: AC
  Administered 2021-10-23: 2 g via INTRAVENOUS
  Filled 2021-10-23: qty 100

## 2021-10-23 MED ORDER — 0.9 % SODIUM CHLORIDE (POUR BTL) OPTIME
TOPICAL | Status: DC | PRN
  Administered 2021-10-23: 1000 mL

## 2021-10-23 MED ORDER — ONDANSETRON HCL 4 MG/2ML IJ SOLN
INTRAMUSCULAR | Status: DC | PRN
  Administered 2021-10-23: 4 mg via INTRAVENOUS

## 2021-10-23 MED ORDER — ROCURONIUM BROMIDE 10 MG/ML (PF) SYRINGE
PREFILLED_SYRINGE | INTRAVENOUS | Status: AC
  Filled 2021-10-23: qty 10

## 2021-10-23 MED ORDER — FENTANYL CITRATE (PF) 100 MCG/2ML IJ SOLN
INTRAMUSCULAR | Status: DC | PRN
  Administered 2021-10-23: 200 ug via INTRAVENOUS
  Administered 2021-10-23: 50 ug via INTRAVENOUS

## 2021-10-23 MED ORDER — BUPIVACAINE LIPOSOME 1.3 % IJ SUSP: 20.0000 mL | Freq: Once | INTRAMUSCULAR | Status: DC

## 2021-10-23 MED ORDER — LACTATED RINGERS IV SOLN: INTRAVENOUS | Status: DC

## 2021-10-23 MED ORDER — SUGAMMADEX SODIUM 200 MG/2ML IV SOLN
INTRAVENOUS | Status: DC | PRN
  Administered 2021-10-23: 200 mg via INTRAVENOUS

## 2021-10-23 MED ORDER — HYDROMORPHONE HCL 1 MG/ML IJ SOLN: 0.2500 mg | INTRAMUSCULAR | Status: DC | PRN

## 2021-10-23 MED ORDER — LIDOCAINE 2% (20 MG/ML) 5 ML SYRINGE
INTRAMUSCULAR | Status: DC | PRN
  Administered 2021-10-23: 80 mg via INTRAVENOUS

## 2021-10-23 MED ORDER — AMISULPRIDE (ANTIEMETIC) 5 MG/2ML IV SOLN: 10.0000 mg | Freq: Once | INTRAVENOUS | Status: DC | PRN

## 2021-10-23 MED ORDER — BUPIVACAINE LIPOSOME 1.3 % IJ SUSP
INTRAMUSCULAR | Status: AC
  Filled 2021-10-23: qty 20

## 2021-10-23 MED ORDER — ONDANSETRON HCL 4 MG/2ML IJ SOLN: 4.0000 mg | Freq: Once | INTRAMUSCULAR | Status: DC | PRN

## 2021-10-23 MED ORDER — BUPIVACAINE-EPINEPHRINE 0.25% -1:200000 IJ SOLN
INTRAMUSCULAR | Status: DC | PRN
  Administered 2021-10-23: 30 mL

## 2021-10-23 MED ORDER — BUPIVACAINE LIPOSOME 1.3 % IJ SUSP
INTRAMUSCULAR | Status: DC | PRN
  Administered 2021-10-23: 20 mL

## 2021-10-23 MED ORDER — MIDAZOLAM HCL 5 MG/5ML IJ SOLN
INTRAMUSCULAR | Status: DC | PRN
  Administered 2021-10-23: 2 mg via INTRAVENOUS

## 2021-10-23 MED ORDER — CHLORHEXIDINE GLUCONATE 0.12 % MT SOLN
15.0000 mL | Freq: Once | OROMUCOSAL | Status: AC
  Administered 2021-10-23: 15 mL via OROMUCOSAL

## 2021-10-23 MED ORDER — DEXAMETHASONE SODIUM PHOSPHATE 10 MG/ML IJ SOLN
INTRAMUSCULAR | Status: DC | PRN
  Administered 2021-10-23: 10 mg via INTRAVENOUS

## 2021-10-23 MED ORDER — HYDROMORPHONE HCL 1 MG/ML IJ SOLN
INTRAMUSCULAR | Status: DC | PRN
  Administered 2021-10-23 (×2): .5 mg via INTRAVENOUS

## 2021-10-23 MED ORDER — LIDOCAINE HCL (PF) 2 % IJ SOLN
INTRAMUSCULAR | Status: AC
  Filled 2021-10-23: qty 5

## 2021-10-23 MED ORDER — ROCURONIUM BROMIDE 10 MG/ML (PF) SYRINGE
PREFILLED_SYRINGE | INTRAVENOUS | Status: DC | PRN
  Administered 2021-10-23: 80 mg via INTRAVENOUS

## 2021-10-23 MED ORDER — MIDAZOLAM HCL 2 MG/2ML IJ SOLN
INTRAMUSCULAR | Status: AC
  Filled 2021-10-23: qty 2

## 2021-10-23 MED ORDER — KETAMINE HCL 10 MG/ML IJ SOLN
INTRAMUSCULAR | Status: DC | PRN
  Administered 2021-10-23: 10 mg via INTRAVENOUS
  Administered 2021-10-23 (×2): 20 mg via INTRAVENOUS

## 2021-10-23 SURGICAL SUPPLY — 52 items
BAG COUNTER SPONGE SURGICOUNT (BAG) ×1 IMPLANT
BENZOIN TINCTURE PRP APPL 2/3 (GAUZE/BANDAGES/DRESSINGS) ×1 IMPLANT
BINDER ABDOMINAL 12 ML 46-62 (SOFTGOODS) IMPLANT
CABLE HIGH FREQUENCY MONO STRZ (ELECTRODE) IMPLANT
CHLORAPREP W/TINT 26 (MISCELLANEOUS) ×1 IMPLANT
COVER SURGICAL LIGHT HANDLE (MISCELLANEOUS) ×1 IMPLANT
DERMABOND IMPLANT
DERMABOND ADVANCED .7 DNX12 (GAUZE/BANDAGES/DRESSINGS) ×1 IMPLANT
DEVICE SECURE STRAP 25 ABSORB (INSTRUMENTS) IMPLANT
DEVICE TROCAR PUNCTURE CLOSURE (ENDOMECHANICALS) ×1 IMPLANT
DRAPE LAPAROSCOPIC ABDOMINAL (DRAPES) ×1 IMPLANT
DRSG TEGADERM 2-3/8X2-3/4 SM (GAUZE/BANDAGES/DRESSINGS) IMPLANT
ELECT REM PT RETURN 15FT ADLT (MISCELLANEOUS) ×1 IMPLANT
GLOVE BIO SURGEON STRL SZ7.5 (GLOVE) ×1 IMPLANT
GLOVE INDICATOR 8.0 STRL GRN (GLOVE) ×2 IMPLANT
GOWN STRL REUS W/ TWL XL LVL3 (GOWN DISPOSABLE) ×2 IMPLANT
GOWN STRL REUS W/TWL XL LVL3 (GOWN DISPOSABLE) ×2
IRRIG SUCT STRYKERFLOW 2 WTIP (MISCELLANEOUS)
IRRIGATION SUCT STRKRFLW 2 WTP (MISCELLANEOUS) IMPLANT
KIT BASIN OR (CUSTOM PROCEDURE TRAY) ×1 IMPLANT
KIT TURNOVER KIT A (KITS) IMPLANT
MARKER SKIN DUAL TIP RULER LAB (MISCELLANEOUS) ×1 IMPLANT
MESH VENTRALIGHT ST 4X6IN (Mesh General) IMPLANT
NDL INSUFFLATION 14GA 120MM (NEEDLE) ×1 IMPLANT
NDL SPNL 22GX3.5 QUINCKE BK (NEEDLE) ×1 IMPLANT
NEEDLE INSUFFLATION 14GA 120MM (NEEDLE) ×1 IMPLANT
NEEDLE SPNL 22GX3.5 QUINCKE BK (NEEDLE) ×1 IMPLANT
PAD POSITIONING PINK XL (MISCELLANEOUS) IMPLANT
PENCIL SMOKE EVACUATOR (MISCELLANEOUS) IMPLANT
PROTECTOR NERVE ULNAR (MISCELLANEOUS) IMPLANT
SCISSORS LAP 5X35 DISP (ENDOMECHANICALS) ×1 IMPLANT
SET TUBE SMOKE EVAC HIGH FLOW (TUBING) ×1 IMPLANT
SHEARS HARMONIC ACE PLUS 36CM (ENDOMECHANICALS) IMPLANT
SLEEVE ADV FIXATION 5X100MM (TROCAR) ×2 IMPLANT
SLEEVE Z-THREAD 5X100MM (TROCAR) ×1 IMPLANT
SOL ANTI FOG 6CC (MISCELLANEOUS) IMPLANT
SOLUTION ANTI FOG 6CC (MISCELLANEOUS) ×1
SPIKE FLUID TRANSFER (MISCELLANEOUS) ×1 IMPLANT
STRIP CLOSURE SKIN 1/2X4 (GAUZE/BANDAGES/DRESSINGS) ×1 IMPLANT
SUT ETHIBOND 0 MO6 C/R (SUTURE) IMPLANT
SUT MNCRL AB 4-0 PS2 18 (SUTURE) ×1 IMPLANT
SUT NOVA NAB DX-16 0-1 5-0 T12 (SUTURE) IMPLANT
SUT PROLENE 0 CT 1 CR/8 (SUTURE) IMPLANT
TAPE CLOTH 4X10 WHT NS (GAUZE/BANDAGES/DRESSINGS) IMPLANT
TOWEL OR 17X26 10 PK STRL BLUE (TOWEL DISPOSABLE) ×1 IMPLANT
TRAY FOLEY MTR SLVR 14FR STAT (SET/KITS/TRAYS/PACK) IMPLANT
TRAY FOLEY MTR SLVR 16FR STAT (SET/KITS/TRAYS/PACK) IMPLANT
TRAY LAPAROSCOPIC (CUSTOM PROCEDURE TRAY) ×1 IMPLANT
TROCAR 11X100 Z THREAD (TROCAR) IMPLANT
TROCAR ADV FIXATION 5X100MM (TROCAR) IMPLANT
TROCAR BALLN 12MMX100 BLUNT (TROCAR) IMPLANT
TROCAR Z-THREAD OPTICAL 5X100M (TROCAR) ×1 IMPLANT

## 2021-10-23 NOTE — Anesthesia Preprocedure Evaluation (Signed)
Anesthesia Evaluation  Patient identified by MRN, date of birth, ID band Patient awake    Reviewed: Allergy & Precautions, NPO status , Patient's Chart, lab work & pertinent test results, reviewed documented beta blocker date and time   History of Anesthesia Complications (+) Family history of anesthesia reaction  Airway Mallampati: III  TM Distance: >3 FB Neck ROM: Full    Dental no notable dental hx. (+) Teeth Intact, Dental Advisory Given   Pulmonary    Pulmonary exam normal breath sounds clear to auscultation       Cardiovascular negative cardio ROS Normal cardiovascular exam Rhythm:Regular Rate:Normal     Neuro/Psych PSYCHIATRIC DISORDERS Depression negative neurological ROS     GI/Hepatic negative GI ROS, Neg liver ROS, Hepatic steatosis   Endo/Other  diabetes, Well Controlled, Type 2, Oral Hypoglycemic AgentsGLP- Agonist therapy last dose  Renal/GU Renal diseaseHx/o renal cell Ca S/P partial nephrectomy Hx/o nephrolithiasis  negative genitourinary   Musculoskeletal Incisional hernia   Abdominal (+) + obese,   Peds  Hematology negative hematology ROS (+)   Anesthesia Other Findings   Reproductive/Obstetrics                             Anesthesia Physical Anesthesia Plan  ASA: 2  Anesthesia Plan: General   Post-op Pain Management: Precedex and Dilaudid IV   Induction: Intravenous and Cricoid pressure planned  PONV Risk Score and Plan: 4 or greater and Treatment may vary due to age or medical condition, Scopolamine patch - Pre-op, Midazolam, Ondansetron and Dexamethasone  Airway Management Planned: Oral ETT  Additional Equipment: None  Intra-op Plan:   Post-operative Plan: Extubation in OR  Informed Consent: I have reviewed the patients History and Physical, chart, labs and discussed the procedure including the risks, benefits and alternatives for the proposed anesthesia  with the patient or authorized representative who has indicated his/her understanding and acceptance.     Dental advisory given  Plan Discussed with: CRNA and Anesthesiologist  Anesthesia Plan Comments:         Anesthesia Quick Evaluation

## 2021-10-23 NOTE — Transfer of Care (Signed)
Immediate Anesthesia Transfer of Care Note  Patient: Angela Nielsen  Procedure(s) Performed: LAPAROSCOPIC INCISIONAL HERNIA REPAIR WITH MESH, LYSIS OF ADHESIONS, BILATERAL TAP BLOCK  Patient Location: PACU  Anesthesia Type:General  Level of Consciousness: awake, alert  and oriented  Airway & Oxygen Therapy: Patient Spontanous Breathing and Patient connected to face mask oxygen  Post-op Assessment: Report given to RN and Post -op Vital signs reviewed and stable  Post vital signs: Reviewed and stable  Last Vitals:  Vitals Value Taken Time  BP 141/81 10/23/21 1135  Temp 36.4 C 10/23/21 1135  Pulse 80 10/23/21 1136  Resp 18 10/23/21 1136  SpO2 95 % 10/23/21 1136  Vitals shown include unvalidated device data.  Last Pain:  Vitals:   10/23/21 0759  TempSrc: Oral         Complications:  Encounter Notable Events  Notable Event Outcome Phase Comment  Difficult to intubate - expected  Intraprocedure Filed from anesthesia note documentation.

## 2021-10-23 NOTE — Anesthesia Procedure Notes (Signed)
Procedure Name: Intubation Date/Time: 10/23/2021 9:26 AM  Performed by: Maxwell Caul, CRNAPre-anesthesia Checklist: Patient identified, Emergency Drugs available, Suction available and Patient being monitored Patient Re-evaluated:Patient Re-evaluated prior to induction Oxygen Delivery Method: Circle system utilized Preoxygenation: Pre-oxygenation with 100% oxygen Induction Type: IV induction Ventilation: Mask ventilation without difficulty Laryngoscope Size: Glidescope and 3 Grade View: Grade I Tube size: 7.0 mm Number of attempts: 1 Airway Equipment and Method: Stylet Placement Confirmation: positive ETCO2, breath sounds checked- equal and bilateral and ETT inserted through vocal cords under direct vision Secured at: 21 cm Tube secured with: Tape Dental Injury: Teeth and Oropharynx as per pre-operative assessment  Difficulty Due To: Difficulty was anticipated Comments: Due to prior intubation record and airway exam, elective intubation with Glidescope 3. Grade 1 view and 7.0 ETT gently placed.

## 2021-10-23 NOTE — Anesthesia Postprocedure Evaluation (Signed)
Anesthesia Post Note  Patient: Angela Nielsen  Procedure(s) Performed: LAPAROSCOPIC INCISIONAL HERNIA REPAIR WITH MESH, LYSIS OF ADHESIONS, BILATERAL TAP BLOCK     Patient location during evaluation: PACU Anesthesia Type: General Level of consciousness: awake and alert and oriented Pain management: pain level controlled Vital Signs Assessment: post-procedure vital signs reviewed and stable Respiratory status: spontaneous breathing, nonlabored ventilation and respiratory function stable Cardiovascular status: blood pressure returned to baseline and stable Postop Assessment: no apparent nausea or vomiting Anesthetic complications: yes   Encounter Notable Events  Notable Event Outcome Phase Comment  Difficult to intubate - expected  Intraprocedure Filed from anesthesia note documentation.    Last Vitals:  Vitals:   10/23/21 1230 10/23/21 1255  BP: 130/79 (!) 163/89  Pulse: 72 72  Resp: 10 18  Temp:  36.4 C  SpO2: 93% 94%    Last Pain:  Vitals:   10/23/21 1255  TempSrc: Oral  PainSc: 3                  Cabela Pacifico A.

## 2021-10-23 NOTE — Op Note (Signed)
10/23/2021  11:53 AM  PATIENT:  Angela Nielsen  61 y.o. female  Patient Care Team: Ma Hillock, DO as PCP - General (Family Medicine) Eusebio Friendly, MD as Referring Physician (Internal Medicine)  PRE-OPERATIVE DIAGNOSIS:  Incisional hernia  POST-OPERATIVE DIAGNOSIS:  Incisional hernia  PROCEDURE:   Laparoscopic incisional hernia repair with mesh 5 x 7 cm in size Laparoscopic lysis of adhesions x 15 minutes Bilateral transversus abdominus plane blocks  SURGEON:  Sharon Mt. Dema Severin, MD  ASSISTANT: Toula Moos, MD  ANESTHESIA:   local and general  COUNTS:  Sponge, needle and instrument counts were reported correct x2 at the conclusion of the operation.  EBL: 10 mL  DRAINS: None  SPECIMEN: None  COMPLICATIONS: NOne  FINDINGS: Incisional hernia containing adhesions of transverse colon and associated omentum - defect measures 5 x 7 cm in size. This was closed primarily and then the resultant closure reinforced with Ventralight ST mesh, 10.2 x 15.2 cm (Dual sided with hydrogel barrier facing the abdominal viscera).  DISPOSITION: PACU in satisfactory condition  DESCRIPTION: The patient was identified in preop holding and taken to the OR where she was placed on the operating room table. SCDs were placed. General endotracheal anesthesia was induced without difficulty. A foley catheter was placed by nursing. She was then prepped and draped in the usual sterile fashion. A surgical timeout was performed indicating the correct patient, procedure, positioning and need for preoperative antibiotics.   An OG tube was confirmed to be placed and to suction.  At Palmer's point, Veress needle was used to gain access to the abdomen.  Intraperitoneal location was confirmed with aspiration and the saline drop test.  Pneumoperitoneum was then established.  At Palmer's point, a 5 mm optical viewing trocar was placed into the abdominal cavity on the first attempt under direct visualization.  The  abdomen is inspected and there is no evidence of Veress needle or trocar site complications.  The abdomen is surveyed and there are adhesions across the location of her prior extraction site containing transverse colon.  There are no other significant abdominal adhesions.  Bilateral transversus abdominis plane blocks were then created using a dilute mixture of Exparel with 0.25% Marcaine with epinephrine.  3 additional 5 mm trocars were placed across the left lower and hypogastric region of the abdomen under direct visualization.  Adhesiolysis commenced taking down the adhesions of the transverse colon from the closure site as well as adhesions of the omentum.  We are able to maintain a plane external to the transverse colon serosa.  After taking out all adhesions, the colon was inspected and noted to be without any evident injury.  There was one oozing spot from the omentum that was controlled with electrocautery.  The hernia defect was then fully evaluated and marked using a spinal needle.  This measured approximately 5 x 7 cm in size.  Healthy appearing fascial edges are appreciated.  The overlying skin and subcutaneous tissue where she had had her prior extraction is then incised sharply.  The hernia sac is entered.  Pneumoperitoneum was released.  All laparoscopic equipment had been removed.  The hernia sac is then fully excised.  Fascial edges are well delineated.  A piece of Ventralight ST mesh (dual sided with hydrogel) is brought onto the field measuring 10.2 x 15.2 cm in diameter.  Sutures were placed at the 12, 3, 6, and 9:00 positions using 0 Ethibond.  Knots are on the side that we will be facing the peritoneum  and away from the abdominal contents leaving the hydrogel barrier side facing the abdominal contents.  This is then rolled and placed in the abdominal cavity.  Fascial edges are well delineated circumferentially and come together without any tension.  In a transverse manner we opted to close  this primarily using #1 PDS sutures.  Pneumoperitoneum was then reestablished.  The defect is noted to be completely approximated and closed without any gaps.  We then brought up the mesh using the previously placed sutures at all 4 positions using a laparoscopic suture passer.  There is excellent coverage of the repair with wide overlap.  There are no scalloped edges and appropriate coverage.  This is then reinforced with a double crown technique using the secure strap absorbable tack device.  The mesh has a nice lay against abdominal wall without any scalloping.  Laparoscopic trocars were removed and hemostasis is verified in each particular port site.  Pneumoperitoneum was released prior to removal of the left upper quadrant port.  All counts were reported correct.  The skin of all trocar sites was closed with 4-0 Monocryl subcuticular suture.  The skin of the former nephrectomy extraction site where we implanted the mesh was closed with 4-0 Monocryl subcuticular suture.  Dermabond was then placed over all skin wounds.  An abdominal binder was applied.  She was then awakened from anesthesia, extubated, and transferred to a stretcher for transport to PACU in satisfactory condition.

## 2021-10-23 NOTE — Discharge Instructions (Signed)
POST OP INSTRUCTIONS  DIET: As tolerated. Follow a light bland diet the first 24 hours after arrival home, such as soup, liquids, crackers, etc.  Be sure to include lots of fluids daily.  Avoid fast food or heavy meals as your are more likely to get nauseated.  Eat a low fat the next few days after surgery.  Take your usually prescribed home medications unless otherwise directed.  PAIN CONTROL: Pain is best controlled by a usual combination of three different methods TOGETHER: Ice/Heat Over the counter pain medication Prescription pain medication Most patients will experience some swelling and bruising around the surgical site.  Ice packs or heating pads (30-60 minutes up to 6 times a day) will help. Some people prefer to use ice alone, heat alone, alternating between ice & heat.  Experiment to what works for you.  Swelling and bruising can take several weeks to resolve.   It is helpful to take an over-the-counter pain medication regularly for the first few weeks: Ibuprofen (Motrin/Advil) - '200mg'$  tabs - take 3 tabs ('600mg'$ ) every 6 hours as needed for pain Acetaminophen (Tylenol) - you may take '650mg'$  every 6 hours as needed. You can take this with motrin as they act differently on the body. If you are taking a narcotic pain medication that has acetaminophen in it, do not take over the counter tylenol at the same time.  Iii. NOTE: You may take both of these medications together - most patients  find it most helpful when alternating between the two (i.e. Ibuprofen at 6am,  tylenol at 9am, ibuprofen at 12pm ..Marland Kitchen) A  prescription for pain medication should be given to you upon discharge.  Take your pain medication as prescribed if your pain is not adequatly controlled with the over-the-counter pain reliefs mentioned above.  Avoid getting constipated.  Between the surgery and the pain medications, it is common to experience some constipation.  Increasing fluid intake and taking a fiber supplement (such as  Metamucil, Citrucel, FiberCon, MiraLax, etc) 1-2 times a day regularly will usually help prevent this problem from occurring.  A mild laxative (prune juice, Milk of Magnesia, MiraLax, etc) should be taken according to package directions if there are no bowel movements after 48 hours.    Dressing: Your incisions are covered in Dermabond which is like sterile superglue for the skin. All sutures are hidden beneath the skin and dissolve over time, nothing has to be removed. The dermabond will come off on it's own in a couple weeks. It is waterproof and you may bathe normally starting 2 days after your surgery in a shower, ok to get soap/water over the wounds. Avoid baths/pools/lakes/oceans until your wounds have fully healed.  ACTIVITIES as tolerated:   Avoid heavy lifting (>10lbs or 1 gallon of milk) for the next 6 weeks. Abdominal binder - generally people find this helpful for added 'support.' If it is uncomfortable and not pleasant to wear, you may take this off. Bracing with a pillow when coughing/sneezing often provides some comfort as well. You may resume regular (light) daily activities beginning the next day--such as daily self-care, walking, climbing stairs--gradually increasing activities as tolerated.  If you can walk 30 minutes without difficulty, it is safe to try more intense activity such as jogging, treadmill, bicycling, low-impact aerobics.  DO NOT PUSH THROUGH PAIN.  Let pain be your guide: If it hurts to do something, don't do it. You may drive when you are no longer taking prescription pain medication, you can comfortably wear  a seatbelt, and you can safely maneuver your car and apply brakes.   FOLLOW UP in our office Please call [facility] at [phone] to set up an appointment to see your surgeon in the office for a follow-up appointment approximately 2 weeks after your surgery. Make sure that you call for this appointment the day you arrive home to insure a convenient appointment  time.  9. If you have disability or family leave forms that need to be completed, you may have them completed by your primary care physician's office; for return to work instructions, please ask our office staff and they will be happy to assist you in obtaining this documentation   When to call us [phone]: Poor pain control Reactions / problems with new medications (rash/itching, etc)  Fever over 101.5 F (38.5 C) Inability to urinate Nausea/vomiting Worsening swelling or bruising Continued bleeding from incision. Increased pain, redness, or drainage from the incision  The clinic staff is available to answer your questions during regular business hours (8:30am-5pm).  Please don't hesitate to call and ask to speak to one of our nurses for clinical concerns.   A surgeon from [facility] is always on call at the hospitals   If you have a medical emergency, go to the nearest emergency room or call 911.  [facility] [address], [place], [place], [place]  [address] MAIN: [phone] FAX: [phone] [email or web address]

## 2021-10-23 NOTE — H&P (Signed)
CC: Here today for surgery  HPI: Angela Nielsen is an 61 y.o. female with history of pre-DM, RCC , whom is seen in the office today as a referral by Dr. Pasty Arch for evaluation of incisional hernia.  She was taken to the OR with Dr. Lovena Neighbours 10/21/20 for her right renal mass and underwent robotic assisted laparoscopic right partial nephrectomy with intraoperative ultrasound. She recovered well and was discharged home the following day. In the interim, she has developed an incisional hernia at the extraction site.  She has noticed a bulge at this location. It is occasionally noticeable if she turns over in bed at night. She denies any significant pain related to this. She denies any history of feeling that this is "stuck." She denies any nausea or vomiting or changes in bowel habits.  CT A/P 03/27/21 -  1. Postoperative changes of partial right nephrectomy without evidence of recurrent or metastatic disease. 2. Borderline enlarged steatotic liver. 3. Low-attenuation lesions in the pancreas are better seen on 07/25/2020. Please refer to that study for further details and recommendations. 4. Left renal stone. 5. Midline ventral hernia contains fat.  I have personally reviewed the CT scan. Her hernia defect is incisional and contains at least as of February fat. The neck of this measures approximately 3 x 2 cm.  Returned to office 09/13/21 indicating she would like to proceed with scheduling surgery to address her incisional hernia. She has been doing reasonably well. Her hernia still bothers her particular when she is turning over in bed at night she always has to brace herself. Mild discomfort throughout the day. Never been severely painful or any evident incarceration events. She remains concerned that something could get incarcerated however and is always thinking about this. She was started on Ozempic in the interim and has lost 10 pounds. Working to lose more.  She denies any changes in her health  or health history since we met in the office. States she is ready for surgery.   PMH: Renal cell carcinoma, pre-DM  PSH: Robot assisted lap right partial nephrectomy (RCC) 10/21/20  FHx: Denies any known family history of colorectal, breast, endometrial or ovarian cancer  Social Hx: Denies use of tobacco/EtOH/illicit drug. She is here today with her husband. She works as an Psychologist, prison and probation services at [employer].  Past Medical History:  Diagnosis Date   Allergies    Chicken pox    Depression    Family history of adverse reaction to anesthesia    aunt difficult to wake up to medication no longer used   Fibrocystic breast disease    Kidney stone    Renal cell carcinoma (HCC)    Skin cancer    Basal cell     Past Surgical History:  Procedure Laterality Date   BASAL CELL CARCINOMA EXCISION  2023   Rt shoulder   CESAREAN SECTION  1995   COLONOSCOPY  2015   MOHS SURGERY  2010   nose and shoulder    ROBOTIC ASSITED PARTIAL NEPHRECTOMY Right 10/21/2020   Procedure: RIGHT ROBOTIC PARTIAL NEPHRECTOMY;  Surgeon: Ceasar Mons, MD;  Location: [facility];  Service: Urology;  Laterality: Right;   TONSILLECTOMY AND ADENOIDECTOMY  1969    Family History  Problem Relation Age of Onset   Breast cancer Mother        BRCA +   Heart disease Mother    Stroke Mother    Gout Father    COPD Father  Hyperlipidemia Father    Hypertension Brother    Hyperlipidemia Brother    Hypertension Brother     Social:  reports that she has never smoked. She has never used smokeless tobacco. She reports current alcohol use. She reports that she does not currently use drugs.  Allergies: No Known Allergies  Medications: I have reviewed the patient's current medications.  No results found for this or any previous visit (from the past 48 hour(s)).  No results found.  ROS - all of the below systems have been reviewed with the patient and positives are indicated with bold text General:  chills, fever or night sweats Eyes: blurry vision or double vision ENT: epistaxis or sore throat Allergy/Immunology: itchy/watery eyes or nasal congestion Hematologic/Lymphatic: bleeding problems, blood clots or swollen lymph nodes Endocrine: temperature intolerance or unexpected weight changes Breast: new or changing breast lumps or nipple discharge Resp: cough, shortness of breath, or wheezing CV: chest pain or dyspnea on exertion GI: as per HPI GU: dysuria, trouble voiding, or hematuria MSK: joint pain or joint stiffness Neuro: TIA or stroke symptoms Derm: pruritus and skin lesion changes Psych: anxiety and depression  PE Blood pressure 131/77, pulse 74, temperature (!) 97.5 F (36.4 C), temperature source Oral, resp. rate 18, height _0  (1.651 m), weight 85.3 kg, last menstrual period 08/24/2013, SpO2 100 %. Constitutional: NAD; conversant Eyes: Moist conjunctiva; no lid lag; PERRL Lungs: Normal respiratory effort CV: RRR; no pitting edema GI: Abd soft, NT/ND. No palpable hepatosplenomegaly. Incisional hernia with associated bulge MSK: Normal range of motion of extremities Psychiatric: Appropriate affect; alert and oriented x3  No results found for this or any previous visit (from the past 48 hour(s)).  No results found.  A/P: Angela Nielsen is an 61 y.o. female with hx of RCC, pre-DM here for surgery regarding incisional hernia (prior robotic assisted partial right nephrectomy 10/21/2020)  BMI 31  -The hernia is evident on her CT scan from 03/27/2021. We spent time reviewing this with her today and plans moving forward.  -Returnsed 09/13/21 as her hernia has been giving her some degree of discomfort now and has remained always on her mind causing some degree of fear of potential incarceration events.  -We discussed the relative anatomy of the abdominal wall. We discussed what an incisional hernia represents. We have again discussed strategies to address this including both  operative and nonoperative approaches. We reviewed minimally invasive techniques-laparoscopic incisional hernia repair with mesh. We discussed the technical aspects of the procedure. We spent time discussing the benefits of mesh placement including reducing the risk of recurrence relative to a primary repair.  -The planned procedure, material risks (including, but not limited to, pain, bleeding, infection, scarring, need for blood transfusion, damage to surrounding structures-blood vessels/nerves/viscus/organs, need for additional procedures, recurrence of hernia, chronic pain, mesh-related complications including erosion into other structures/vessels/organs/viscus, worsening of pre-existing medical conditions, pneumonia, heart attack, stroke, and rarely death) benefits and alternatives to surgery were discussed at length. I noted a good probability that the procedure help improve her symptoms. The patient's questions were answered to her and her husband's satisfaction, she voiced understanding and has elected to proceed with surgery   Nadeen Landau, MD [facility], [place]

## 2021-10-24 ENCOUNTER — Encounter (HOSPITAL_COMMUNITY): Payer: Self-pay | Admitting: Surgery

## 2022-01-11 ENCOUNTER — Encounter: Payer: Commercial Managed Care - PPO | Admitting: Family Medicine

## 2022-01-25 ENCOUNTER — Encounter: Payer: Commercial Managed Care - PPO | Admitting: Family Medicine

## 2022-02-15 ENCOUNTER — Ambulatory Visit (INDEPENDENT_AMBULATORY_CARE_PROVIDER_SITE_OTHER): Payer: Commercial Managed Care - PPO | Admitting: Family Medicine

## 2022-02-15 ENCOUNTER — Encounter: Payer: Self-pay | Admitting: Family Medicine

## 2022-02-15 ENCOUNTER — Telehealth: Payer: Self-pay

## 2022-02-15 VITALS — BP 135/81 | HR 76 | Temp 97.8°F | Ht 65.0 in | Wt 184.0 lb

## 2022-02-15 DIAGNOSIS — K760 Fatty (change of) liver, not elsewhere classified: Secondary | ICD-10-CM | POA: Diagnosis not present

## 2022-02-15 DIAGNOSIS — R7303 Prediabetes: Secondary | ICD-10-CM | POA: Diagnosis not present

## 2022-02-15 DIAGNOSIS — C642 Malignant neoplasm of left kidney, except renal pelvis: Secondary | ICD-10-CM

## 2022-02-15 DIAGNOSIS — Z0000 Encounter for general adult medical examination without abnormal findings: Secondary | ICD-10-CM | POA: Diagnosis not present

## 2022-02-15 DIAGNOSIS — Z1231 Encounter for screening mammogram for malignant neoplasm of breast: Secondary | ICD-10-CM

## 2022-02-15 DIAGNOSIS — Z1211 Encounter for screening for malignant neoplasm of colon: Secondary | ICD-10-CM

## 2022-02-15 DIAGNOSIS — Z130 Encounter for screening for diseases of the blood and blood-forming organs and certain disorders involving the immune mechanism: Secondary | ICD-10-CM | POA: Diagnosis not present

## 2022-02-15 DIAGNOSIS — E669 Obesity, unspecified: Secondary | ICD-10-CM

## 2022-02-15 DIAGNOSIS — Z23 Encounter for immunization: Secondary | ICD-10-CM

## 2022-02-15 DIAGNOSIS — D72829 Elevated white blood cell count, unspecified: Secondary | ICD-10-CM

## 2022-02-15 LAB — LIPID PANEL
Cholesterol: 211 mg/dL — ABNORMAL HIGH (ref 0–200)
HDL: 57.2 mg/dL (ref >39.00)
LDL Cholesterol: 126 mg/dL — ABNORMAL HIGH (ref 0–99)
NonHDL: 153.33
Total CHOL/HDL Ratio: 4
Triglycerides: 135 mg/dL (ref 0.0–149.0)
VLDL: 27 mg/dL (ref 0.0–40.0)

## 2022-02-15 LAB — COMPREHENSIVE METABOLIC PANEL
ALT: 45 U/L — ABNORMAL HIGH (ref 0–35)
AST: 35 U/L (ref 0–37)
Albumin: 4.5 g/dL (ref 3.5–5.2)
Alkaline Phosphatase: 71 U/L (ref 39–117)
BUN: 14 mg/dL (ref 6–23)
CO2: 29 mEq/L (ref 19–32)
Calcium: 9.9 mg/dL (ref 8.4–10.5)
Chloride: 101 mEq/L (ref 96–112)
Creatinine, Ser: 0.97 mg/dL (ref 0.40–1.20)
GFR: 64.17 mL/min (ref >60.00)
Glucose, Bld: 113 mg/dL — ABNORMAL HIGH (ref 70–99)
Potassium: 4.2 mEq/L (ref 3.5–5.1)
Sodium: 138 mEq/L (ref 135–145)
Total Bilirubin: 0.7 mg/dL (ref 0.2–1.2)
Total Protein: 7.3 g/dL (ref 6.0–8.3)

## 2022-02-15 LAB — CBC
HCT: 44 % (ref 36.0–46.0)
Hemoglobin: 14.7 g/dL (ref 12.0–15.0)
MCHC: 33.4 g/dL (ref 30.0–36.0)
MCV: 88 fl (ref 78.0–100.0)
Platelets: 357 10*3/uL (ref 150.0–400.0)
RBC: 5 Mil/uL (ref 3.87–5.11)
RDW: 13.5 % (ref 11.5–15.5)
WBC: 11.8 10*3/uL — ABNORMAL HIGH (ref 4.0–10.5)

## 2022-02-15 LAB — TSH: TSH: 1.45 u[IU]/mL (ref 0.35–5.50)

## 2022-02-15 LAB — HEMOGLOBIN A1C: Hgb A1c MFr Bld: 6.2 % (ref 4.6–6.5)

## 2022-02-15 MED ORDER — HYDROXYZINE HCL 10 MG PO TABS: 10.0000 mg | ORAL_TABLET | Freq: Every evening | ORAL | 3 refills | Status: DC

## 2022-02-15 NOTE — Patient Instructions (Signed)
No follow-ups on file.        Great to see you today.  I have refilled the medication(s) we provide.   If labs were collected, we will inform you of lab results once received either by echart message or telephone call.   - echart message- for normal results that have been seen by the patient already.   - telephone call: abnormal results or if patient has not viewed results in their echart.  Health Maintenance, Female Adopting a healthy lifestyle and getting preventive care are important in promoting health and wellness. Ask your health care provider about: The right schedule for you to have regular tests and exams. Things you can do on your own to prevent diseases and keep yourself healthy. What should I know about diet, weight, and exercise? Eat a healthy diet  Eat a diet that includes plenty of vegetables, fruits, low-fat dairy products, and lean protein. Do not eat a lot of foods that are high in solid fats, added sugars, or sodium. Maintain a healthy weight Body mass index (BMI) is used to identify weight problems. It estimates body fat based on height and weight. Your health care provider can help determine your BMI and help you achieve or maintain a healthy weight. Get regular exercise Get regular exercise. This is one of the most important things you can do for your health. Most adults should: Exercise for at least 150 minutes each week. The exercise should increase your heart rate and make you sweat (moderate-intensity exercise). Do strengthening exercises at least twice a week. This is in addition to the moderate-intensity exercise. Spend less time sitting. Even light physical activity can be beneficial. Watch cholesterol and blood lipids Have your blood tested for lipids and cholesterol at 60 years of age, then have this test every 5 years. Have your cholesterol levels checked more often if: Your lipid or cholesterol levels are high. You are older than 60 years of  age. You are at high risk for heart disease. What should I know about cancer screening? Depending on your health history and family history, you may need to have cancer screening at various ages. This may include screening for: Breast cancer. Cervical cancer. Colorectal cancer. Skin cancer. Lung cancer. What should I know about heart disease, diabetes, and high blood pressure? Blood pressure and heart disease High blood pressure causes heart disease and increases the risk of stroke. This is more likely to develop in people who have high blood pressure readings or are overweight. Have your blood pressure checked: Every 3-5 years if you are 18-39 years of age. Every year if you are 40 years old or older. Diabetes Have regular diabetes screenings. This checks your fasting blood sugar level. Have the screening done: Once every three years after age 40 if you are at a normal weight and have a low risk for diabetes. More often and at a younger age if you are overweight or have a high risk for diabetes. What should I know about preventing infection? Hepatitis B If you have a higher risk for hepatitis B, you should be screened for this virus. Talk with your health care provider to find out if you are at risk for hepatitis B infection. Hepatitis C Testing is recommended for: Everyone born from 1945 through 1965. Anyone with known risk factors for hepatitis C. Sexually transmitted infections (STIs) Get screened for STIs, including gonorrhea and chlamydia, if: You are sexually active and are younger than 60 years of age. You are   older than 60 years of age and your health care provider tells you that you are at risk for this type of infection. Your sexual activity has changed since you were last screened, and you are at increased risk for chlamydia or gonorrhea. Ask your health care provider if you are at risk. Ask your health care provider about whether you are at high risk for HIV. Your health  care provider may recommend a prescription medicine to help prevent HIV infection. If you choose to take medicine to prevent HIV, you should first get tested for HIV. You should then be tested every 3 months for as long as you are taking the medicine. Pregnancy If you are about to stop having your period (premenopausal) and you may become pregnant, seek counseling before you get pregnant. Take 400 to 800 micrograms (mcg) of folic acid every day if you become pregnant. Ask for birth control (contraception) if you want to prevent pregnancy. Osteoporosis and menopause Osteoporosis is a disease in which the bones lose minerals and strength with aging. This can result in bone fractures. If you are 65 years old or older, or if you are at risk for osteoporosis and fractures, ask your health care provider if you should: Be screened for bone loss. Take a calcium or vitamin D supplement to lower your risk of fractures. Be given hormone replacement therapy (HRT) to treat symptoms of menopause. Follow these instructions at home: Alcohol use Do not drink alcohol if: Your health care provider tells you not to drink. You are pregnant, may be pregnant, or are planning to become pregnant. If you drink alcohol: Limit how much you have to: 0-1 drink a day. Know how much alcohol is in your drink. In the [place] one drink equals one 12 oz bottle of beer (355 mL), one 5 oz glass of wine (148 mL), or one 1 oz glass of hard liquor (44 mL). Lifestyle Do not use any products that contain nicotine or tobacco. These products include cigarettes, chewing tobacco, and vaping devices, such as e-cigarettes. If you need help quitting, ask your health care provider. Do not use street drugs. Do not share needles. Ask your health care provider for help if you need support or information about quitting drugs. General instructions Schedule regular health, dental, and eye exams. Stay current with your vaccines. Tell your health  care provider if: You often feel depressed. You have ever been abused or do not feel safe at home. Summary Adopting a healthy lifestyle and getting preventive care are important in promoting health and wellness. Follow your health care provider's instructions about healthy diet, exercising, and getting tested or screened for diseases. Follow your health care provider's instructions on monitoring your cholesterol and blood pressure. This information is not intended to replace advice given to you by your health care provider. Make sure you discuss any questions you have with your health care provider. Document Revised: 06/13/2020 Document Reviewed: 06/13/2020 Elsevier Patient Education  2023 [employer]  

## 2022-02-15 NOTE — Telephone Encounter (Signed)
Patient was seen today by Dr. Raoul Pitch, she was reading her AVS, and it states that cancer was in her left kidney, this is incorrect. Patient states she had cancer in her left kidney.  Please correct documentation per patient request.  She also wanted to let Dr. Raoul Pitch know her mammogram is scheduled for 03/07/22.

## 2022-02-15 NOTE — Progress Notes (Signed)
Patient ID: Angela Nielsen, female  DOB: 10/01/1960, 62 y.o.   MRN: [id number] Patient Care Team    Relationship Specialty Notifications Start End  Ma Hillock, DO PCP - General Family Medicine  08/25/19   Eusebio Friendly, MD Referring Physician Internal Medicine  06/15/20    Comment: Gastroenterology    Chief Complaint  Patient presents with   Annual Exam    Pt is fasting     Subjective: Angela Nielsen is a 62 y.o.  Female  present for CPE  All past medical history, surgical history, allergies, family history, immunizations, medications and social history were updated in the electronic medical record today. All recent labs, ED visits and hospitalizations within the last year were reviewed.  Health maintenance:  Colonoscopy: completed 2015-Dr. Cindee Salt (digestive health)-10-year follow-up Mammogram: completed: 12/06/2020, [facility] P> ordered  Cervical cancer screening: last pap: 2021, 5-year follow-up.  PCP Immunizations: tdap 2015 UTD, Influenza declined (encouraged yearly), Shingrix series completed Infectious disease screening: HIV and Hep C screenings completed DEXA: Routine screening Patient has a Dental home. Hospitalizations/ED visits: Reviewed      02/15/2022    9:37 AM 01/24/2021    1:56 PM 01/19/2020    9:57 AM 08/25/2019   10:09 AM  Depression screen PHQ 2/9  Decreased Interest 0 0 0 0  Down, Depressed, Hopeless  0 0 0  PHQ - 2 Score 0 0 0 0  Altered sleeping    1  Tired, decreased energy    0  Change in appetite    0  Feeling bad or failure about yourself     0  Trouble concentrating    0  Moving slowly or fidgety/restless    0  Suicidal thoughts    0  PHQ-9 Score    1  Difficult doing work/chores    Not difficult at all       No data to display          Immunization History  Administered Date(s) Administered   Influenza, Seasonal, Injecte, Preservative Fre 11/05/2017   Influenza,inj,Quad PF,6+ Mos 11/25/2019, 11/21/2020   PFIZER(Purple  Top)SARS-COV-2 Vaccination 05/07/2019, 06/08/2019   Pneumococcal Conjugate-13 02/05/2014   Tdap 11/20/2013   Zoster Recombinat (Shingrix) 08/25/2019, 11/25/2019     Past Medical History:  Diagnosis Date   Actinic keratosis 06/14/2020   Allergies    Axillary mass 12/07/2020   FINDINGS:  In the right axilla, a possible mass warrants further evaluation. In  the left breast, no findings suspicious for malignancy.   Chicken pox    Depression    Family history of adverse reaction to anesthesia    aunt difficult to wake up to medication no longer used   Fibrocystic breast disease    Inflamed seborrheic keratosis 06/14/2020   Kidney stone    Renal cell carcinoma (HCC)    Skin cancer    Basal cell    No Known Allergies Past Surgical History:  Procedure Laterality Date   BASAL CELL CARCINOMA EXCISION  2023   Rt shoulder   CESAREAN SECTION  1995   COLONOSCOPY  2015   INCISIONAL HERNIA REPAIR N/A 10/23/2021   Procedure: LAPAROSCOPIC INCISIONAL HERNIA REPAIR WITH MESH, LYSIS OF ADHESIONS, BILATERAL TAP BLOCK;  Surgeon: Ileana Roup, MD;  Location: [facility];  Service: General;  Laterality: N/A;   MOHS SURGERY  2010   nose and shoulder    ROBOTIC ASSITED PARTIAL NEPHRECTOMY Right 10/21/2020   Procedure: RIGHT ROBOTIC PARTIAL NEPHRECTOMY;  Surgeon:  Ceasar Mons, MD;  Location: [facility];  Service: Urology;  Laterality: Right;   TONSILLECTOMY AND ADENOIDECTOMY  1969   Family History  Problem Relation Age of Onset   Breast cancer Mother        BRCA +   Heart disease Mother    Stroke Mother    Gout Father    COPD Father    Hyperlipidemia Father    Hypertension Brother    Hyperlipidemia Brother    Hypertension Brother    Social History   Social History Narrative   Not on file    Allergies as of 02/15/2022   No Known Allergies      Medication List        Accurate as of February 15, 2022 10:14 AM. If you have any questions, ask your nurse or doctor.           STOP taking these medications    metFORMIN 500 MG tablet Commonly known as: GLUCOPHAGE Stopped by: Howard Pouch, DO       TAKE these medications    cetirizine 10 MG tablet Commonly known as: ZYRTEC Take 10 mg by mouth daily as needed for allergies.   hydrOXYzine 10 MG tablet Commonly known as: ATARAX Take 1-5 tablets (10-50 mg total) by mouth at bedtime. Started by: Howard Pouch, DO   ondansetron 8 MG tablet Commonly known as: ZOFRAN Take by mouth every 8 (eight) hours as needed for nausea or vomiting. What changed: Another medication with the same name was removed. Continue taking this medication, and follow the directions you see here. Changed by: Howard Pouch, DO   Ozempic (2 MG/DOSE) 8 MG/3ML Sopn Generic drug: Semaglutide (2 MG/DOSE) Inject into the skin. What changed: Another medication with the same name was removed. Continue taking this medication, and follow the directions you see here. Changed by: Howard Pouch, DO        All past medical history, surgical history, allergies, family history, immunizations andmedications were updated in the EMR today and reviewed under the history and medication portions of their EMR.     No results found for this or any previous visit (from the past 2160 hour(s)).  No results found.   ROS 14 pt review of systems performed and negative (unless mentioned in an HPI)  Objective: BP 135/81   Pulse 76   Temp 97.8 F (36.6 C) (Oral)   Ht '5\' 5"'$  (1.651 m)   LMP 08/24/2013   SpO2 100%   BMI 31.28 kg/m  Physical Exam Vitals and nursing note reviewed.  Constitutional:      General: She is not in acute distress.    Appearance: Normal appearance. She is not ill-appearing or toxic-appearing.  HENT:     Head: Normocephalic and atraumatic.     Right Ear: Tympanic membrane, ear canal and external ear normal. There is no impacted cerumen.     Left Ear: Tympanic membrane, ear canal and external ear normal. There is no  impacted cerumen.     Nose: No congestion or rhinorrhea.     Mouth/Throat:     Mouth: Mucous membranes are moist.     Pharynx: Oropharynx is clear. No oropharyngeal exudate or posterior oropharyngeal erythema.  Eyes:     General: No scleral icterus.       Right eye: No discharge.        Left eye: No discharge.     Extraocular Movements: Extraocular movements intact.     Conjunctiva/sclera: Conjunctivae normal.  Pupils: Pupils are equal, round, and reactive to light.  Cardiovascular:     Rate and Rhythm: Normal rate and regular rhythm.     Pulses: Normal pulses.     Heart sounds: Normal heart sounds. No murmur heard.    No friction rub. No gallop.  Pulmonary:     Effort: Pulmonary effort is normal. No respiratory distress.     Breath sounds: Normal breath sounds. No stridor. No wheezing, rhonchi or rales.  Chest:     Chest wall: No tenderness.  Abdominal:     General: Abdomen is flat. Bowel sounds are normal. There is no distension.     Palpations: Abdomen is soft. There is no mass.     Tenderness: There is no abdominal tenderness. There is no right CVA tenderness, left CVA tenderness, guarding or rebound.     Hernia: No hernia is present.  Musculoskeletal:        General: No swelling, tenderness or deformity. Normal range of motion.     Cervical back: Normal range of motion and neck supple. No rigidity or tenderness.     Right lower leg: No edema.     Left lower leg: No edema.  Lymphadenopathy:     Cervical: No cervical adenopathy.  Skin:    General: Skin is warm and dry.     Coloration: Skin is not jaundiced or pale.     Findings: No bruising, erythema, lesion or rash.  Neurological:     General: No focal deficit present.     Mental Status: She is alert and oriented to person, place, and time. Mental status is at baseline.     Cranial Nerves: No cranial nerve deficit.     Sensory: No sensory deficit.     Motor: No weakness.     Coordination: Coordination normal.      Gait: Gait normal.     Deep Tendon Reflexes: Reflexes normal.  Psychiatric:        Mood and Affect: Mood normal.        Behavior: Behavior normal.        Thought Content: Thought content normal.        Judgment: Judgment normal.       No results found.  Assessment/plan: Angela Nielsen is a 62 y.o. female present for CPE  Prediabetes/Obesity (BMI 30-39.9) Taking ozempic 2 mg by another patient  - Hemoglobin A1c - TSH Hepatic steatosis - Comprehensive metabolic panel - Lipid panel - TSH Screening for deficiency anemia - CBC Breast cancer screening by mammogram - MM 3D SCREEN BREAST BILATERAL; Future Influenza vaccine needed Declined today Colon cancer screening Due next year  Encounter for preventive health examination Colonoscopy: completed 2015-Dr. Cindee Salt (digestive health)-10-year follow-up Mammogram: completed: 12/06/2020, [facility]> ordered  Cervical cancer screening: last pap: 2021, 5-year follow-up.  PCP Immunizations: tdap 2015 UTD, Influenza declined (encouraged yearly), Shingrix series completed Infectious disease screening: HIV and Hep C screenings completed DEXA: Routine screening Patient was encouraged to exercise greater than 150 minutes a week. Patient was encouraged to choose a diet filled with fresh fruits and vegetables, and lean meats. AVS provided to patient today for education/recommendation on gender specific health and safety maintenance.  Return in about 1 year (around 02/17/2023) for cpe (20 min).   Orders Placed This Encounter  Procedures   MM 3D SCREEN BREAST BILATERAL   CBC   Comprehensive metabolic panel   Hemoglobin A1c   Lipid panel   TSH   Meds ordered this encounter  Medications   hydrOXYzine (ATARAX) 10 MG tablet    Sig: Take 1-5 tablets (10-50 mg total) by mouth at bedtime.    Dispense:  120 tablet    Refill:  3   Referral Orders  No referral(s) requested today     Electronically signed by: Howard Pouch, [place]

## 2022-02-15 NOTE — Telephone Encounter (Signed)
I am not certain where that is being generated from it.  It says right nephrectomy/RCC history everywhere in her chart.  And I did not bill attach that diagnosis to our visit for it to generate anywhere else.

## 2022-02-15 NOTE — Telephone Encounter (Signed)
Called pt who stated that its not her L kidney but R kidney with partial nephrectomy.

## 2022-02-19 ENCOUNTER — Telehealth: Payer: Self-pay

## 2022-02-19 ENCOUNTER — Other Ambulatory Visit: Payer: Self-pay | Admitting: Urology

## 2022-02-19 NOTE — Telephone Encounter (Signed)
Transition Care Management Follow-up Telephone Call Date of discharge and from where: 02/17/22 [facility] How have you been since you were released from the hospital? Pt states that she is doing okay still has occasional pain Any questions or concerns? No  Items Reviewed: Did the pt receive and understand the discharge instructions provided? Yes  Medications obtained and verified? Yes  Other? No  Any new allergies since your discharge? No  Dietary orders reviewed? No Do you have support at home? Yes   Home Care and Equipment/Supplies: Were home health services ordered? not applicable If so, what is the name of the agency? N/a  Has the agency set up a time to come to the patient's home? not applicable Were any new equipment or medical supplies ordered?  No What is the name of the medical supply agency? N/a Were you able to get the supplies/equipment? not applicable Do you have any questions related to the use of the equipment or supplies? No  Functional Questionnaire: (I = Independent and D = Dependent) ADLs: I  Bathing/Dressing- I  Meal Prep- I  Eating- I  Maintaining continence- I  Transferring/Ambulation- I  Managing Meds- I  Follow up appointments reviewed:  PCP Hospital f/u appt confirmed? No  Pt will call if needed and will follow up as needed [facility] f/u appt confirmed?  Pt will call to schedule with [facility]   Are transportation arrangements needed? No  If their condition worsens, is the pt aware to call PCP or go to the Emergency Dept.? Yes Was the patient provided with contact information for the PCP's office or ED? Yes Was to pt encouraged to call back with questions or concerns? Yes

## 2022-02-20 ENCOUNTER — Encounter (HOSPITAL_BASED_OUTPATIENT_CLINIC_OR_DEPARTMENT_OTHER): Payer: Self-pay | Admitting: Urology

## 2022-02-20 NOTE — Progress Notes (Signed)
Pre-op phone call complete. Procedure date and arrival time verified. Patient allergies, past medical history, and medications confirmed. Patient to arrive at 0915. Patient to hold Ozempic until after procedure. Patient can have clear liquids until 0315. Patient advised not to have any NSAIDs within 48 hours prior and no Aspirin or Aspirin products within 72 hours prior. Driver secured.

## 2022-02-23 ENCOUNTER — Encounter (HOSPITAL_BASED_OUTPATIENT_CLINIC_OR_DEPARTMENT_OTHER)
Admission: RE | Disposition: A | Discharge status: Home / Self Care | Payer: Self-pay | Source: Home / Self Care | Attending: Urology

## 2022-02-23 ENCOUNTER — Other Ambulatory Visit: Payer: Self-pay

## 2022-02-23 ENCOUNTER — Ambulatory Visit (HOSPITAL_COMMUNITY): Payer: Commercial Managed Care - PPO

## 2022-02-23 ENCOUNTER — Encounter (HOSPITAL_BASED_OUTPATIENT_CLINIC_OR_DEPARTMENT_OTHER): Payer: Self-pay | Admitting: Urology

## 2022-02-23 ENCOUNTER — Ambulatory Visit (HOSPITAL_COMMUNITY)
Admission: RE | Admit: 2022-02-23 | Discharge: 2022-02-23 | Disposition: A | Discharge status: Home / Self Care | Payer: Commercial Managed Care - PPO | Attending: Urology | Admitting: Urology

## 2022-02-23 DIAGNOSIS — N201 Calculus of ureter: Secondary | ICD-10-CM | POA: Diagnosis present

## 2022-02-23 HISTORY — PX: EXTRACORPOREAL SHOCK WAVE LITHOTRIPSY: SHX1557

## 2022-02-23 SURGERY — LITHOTRIPSY, ESWL
Anesthesia: LOCAL | Laterality: Left

## 2022-02-23 MED ORDER — TAMSULOSIN HCL 0.4 MG PO CAPS: 0.4000 mg | ORAL_CAPSULE | Freq: Every day | ORAL | 1 refills | Status: DC

## 2022-02-23 MED ORDER — DIAZEPAM 5 MG PO TABS
10.0000 mg | ORAL_TABLET | ORAL | Status: AC
  Administered 2022-02-23: 10 mg via ORAL

## 2022-02-23 MED ORDER — DIAZEPAM 5 MG PO TABS
ORAL_TABLET | ORAL | Status: AC
  Filled 2022-02-23: qty 2

## 2022-02-23 MED ORDER — CIPROFLOXACIN HCL 500 MG PO TABS
ORAL_TABLET | ORAL | Status: AC
  Filled 2022-02-23: qty 1

## 2022-02-23 MED ORDER — SODIUM CHLORIDE 0.9 % IV SOLN: INTRAVENOUS | Status: DC

## 2022-02-23 MED ORDER — DIPHENHYDRAMINE HCL 25 MG PO CAPS
25.0000 mg | ORAL_CAPSULE | ORAL | Status: AC
  Administered 2022-02-23: 25 mg via ORAL

## 2022-02-23 MED ORDER — HYDROCODONE-ACETAMINOPHEN 5-325 MG PO TABS: 1.0000 | ORAL_TABLET | Freq: Four times a day (QID) | ORAL | 0 refills | Status: DC | PRN

## 2022-02-23 MED ORDER — CIPROFLOXACIN HCL 500 MG PO TABS
500.0000 mg | ORAL_TABLET | ORAL | Status: AC
  Administered 2022-02-23: 500 mg via ORAL

## 2022-02-23 MED ORDER — DIPHENHYDRAMINE HCL 25 MG PO CAPS
ORAL_CAPSULE | ORAL | Status: AC
  Filled 2022-02-23: qty 1

## 2022-02-23 NOTE — Discharge Instructions (Signed)
  Post Anesthesia Home Care Instructions ? ?Activity: ?Get plenty of rest for the remainder of the day. A responsible individual must stay with you for 24 hours following the procedure.  ?For the next 24 hours, DO NOT: ?-Drive a car ?-Operate machinery ?-Drink alcoholic beverages ?-Take any medication unless instructed by your physician ?-Make any legal decisions or sign important papers. ? ?Meals: ?Start with liquid foods such as gelatin or soup. Progress to regular foods as tolerated. Avoid greasy, spicy, heavy foods. If nausea and/or vomiting occur, drink only clear liquids until the nausea and/or vomiting subsides. Call your physician if vomiting continues. ? ?Special Instructions/Symptoms: ?Your throat may feel dry or sore from the anesthesia or the breathing tube placed in your throat during surgery. If this causes discomfort, gargle with warm salt water. The discomfort should disappear within 24 hours. ? ? ?    ?

## 2022-02-23 NOTE — Op Note (Signed)
See [facility] OP note scanned into chart. Also because of the size, density, location and other factors that cannot be anticipated I feel this will likely be a staged procedure. This fact supersedes any indication in the scanned [facility] operative note to the contrary.  

## 2022-02-23 NOTE — H&P (Signed)
See scanned h&p

## 2022-02-26 ENCOUNTER — Encounter (HOSPITAL_BASED_OUTPATIENT_CLINIC_OR_DEPARTMENT_OTHER): Payer: Self-pay | Admitting: Urology

## 2022-03-02 ENCOUNTER — Telehealth: Payer: Self-pay | Admitting: Family Medicine

## 2022-03-02 NOTE — Telephone Encounter (Signed)
It would not be a bad idea to push her mammogram out a month.  I think that is appropriate.

## 2022-03-02 NOTE — Telephone Encounter (Signed)
Pt would like someone to advise her on rather a mammogram is good idea after her kidney stones.(She says that Dr. Raoul Pitch is aware of this) She is scheduled for one on 03/06/22 and is worried she will have an experience like her last one where she had some inflammation due to recently receiving the flu shot. She is wondering if she should push it out a month due to all the medications she has been on recently that may cause inflammation. Please give the patient a call to discuss.

## 2022-03-02 NOTE — Telephone Encounter (Signed)
Advised pt to call imaging location and ask their suggestion. Pt states they told her to ask her PCP. Pt rescheduled mammogram but would still like advise from PCP

## 2022-03-02 NOTE — Telephone Encounter (Signed)
noted 

## 2022-03-07 DIAGNOSIS — Z1231 Encounter for screening mammogram for malignant neoplasm of breast: Secondary | ICD-10-CM

## 2022-03-22 ENCOUNTER — Ambulatory Visit (HOSPITAL_COMMUNITY)
Admission: RE | Admit: 2022-03-22 | Discharge: 2022-03-22 | Disposition: A | Discharge status: Home / Self Care | Payer: Commercial Managed Care - PPO | Source: Ambulatory Visit | Attending: Urology | Admitting: Urology

## 2022-03-22 ENCOUNTER — Other Ambulatory Visit (HOSPITAL_COMMUNITY): Payer: Self-pay | Admitting: Urology

## 2022-03-22 DIAGNOSIS — C641 Malignant neoplasm of right kidney, except renal pelvis: Secondary | ICD-10-CM

## 2022-03-22 DIAGNOSIS — R52 Pain, unspecified: Secondary | ICD-10-CM

## 2022-03-27 ENCOUNTER — Other Ambulatory Visit: Payer: Self-pay | Admitting: Family Medicine

## 2022-03-27 ENCOUNTER — Other Ambulatory Visit: Payer: Self-pay

## 2022-03-27 DIAGNOSIS — Z1231 Encounter for screening mammogram for malignant neoplasm of breast: Secondary | ICD-10-CM

## 2022-04-04 ENCOUNTER — Ambulatory Visit (INDEPENDENT_AMBULATORY_CARE_PROVIDER_SITE_OTHER): Payer: Commercial Managed Care - PPO

## 2022-04-04 DIAGNOSIS — Z1231 Encounter for screening mammogram for malignant neoplasm of breast: Secondary | ICD-10-CM

## 2022-08-01 ENCOUNTER — Ambulatory Visit (INDEPENDENT_AMBULATORY_CARE_PROVIDER_SITE_OTHER): Payer: Commercial Managed Care - PPO | Admitting: Family Medicine

## 2022-08-01 ENCOUNTER — Encounter: Payer: Self-pay | Admitting: Family Medicine

## 2022-08-01 VITALS — BP 136/74 | HR 78 | Temp 98.2°F | Wt 189.4 lb

## 2022-08-01 DIAGNOSIS — L299 Pruritus, unspecified: Secondary | ICD-10-CM | POA: Diagnosis not present

## 2022-08-01 MED ORDER — HYDROXYZINE PAMOATE 50 MG PO CAPS: 50.0000 mg | ORAL_CAPSULE | Freq: Every day | ORAL | 3 refills | Status: AC

## 2022-08-01 NOTE — Progress Notes (Signed)
Angela Nielsen , 1960-06-10, 62 y.o., female MRN: [id number] Patient Care Team    Relationship Specialty Notifications Start End  Natalia Leatherwood, DO PCP - General Family Medicine  08/25/19   Concepcion Elk, MD Referring Physician Internal Medicine  06/15/20    Comment: Gastroenterology    Chief Complaint  Patient presents with   itching    All over body since last year. States hasn't introduced anything new had hernia repaired last year wonders if the mesh could be causing itching. Thinks it is something internal that is causing her itching. Willie.Bhat- dermatology     Subjective: Angela Nielsen is a 62 y.o. Pt presents for an OV with complaints of pruritus of thoracic back, inner arms and posterior thighs that has been worsening over last year duration.  She explains the symptoms wax/wane and have been mildly improved over last week. Symptoms are present mostly at night, but can occur at any time of day. She noticed mild red raised areas on her chest. She scratches to the point of causing bleeding.  She has been prescribed vistaril and reports this is helpful and allows her to relax.        02/15/2022    9:37 AM 01/24/2021    1:56 PM 01/19/2020    9:57 AM 08/25/2019   10:09 AM  Depression screen PHQ 2/9  Decreased Interest 0 0 0 0  Down, Depressed, Hopeless  0 0 0  PHQ - 2 Score 0 0 0 0  Altered sleeping    1  Tired, decreased energy    0  Change in appetite    0  Feeling bad or failure about yourself     0  Trouble concentrating    0  Moving slowly or fidgety/restless    0  Suicidal thoughts    0  PHQ-9 Score    1  Difficult doing work/chores    Not difficult at all    No Known Allergies Social History   Social History Narrative   Not on file   Past Medical History:  Diagnosis Date   Actinic keratosis 06/14/2020   Allergies    Axillary mass 12/07/2020   FINDINGS:  In the right axilla, a possible mass warrants further evaluation. In  the left breast,  no findings suspicious for malignancy.   Chicken pox    Family history of adverse reaction to anesthesia    aunt difficult to wake up to medication no longer used   Fibrocystic breast disease    Inflamed seborrheic keratosis 06/14/2020   Kidney stone    Renal cell carcinoma (HCC)    Skin cancer    Basal cell    Past Surgical History:  Procedure Laterality Date   BASAL CELL CARCINOMA EXCISION  2023   Rt shoulder   CESAREAN SECTION  1995   COLONOSCOPY  2015   EXTRACORPOREAL SHOCK WAVE LITHOTRIPSY Left 02/23/2022   Procedure: LEFT EXTRACORPOREAL SHOCK WAVE LITHOTRIPSY (ESWL);  Surgeon: Crista Elliot, MD;  Location: [facility];  Service: Urology;  Laterality: Left;   INCISIONAL HERNIA REPAIR N/A 10/23/2021   Procedure: LAPAROSCOPIC INCISIONAL HERNIA REPAIR WITH MESH, LYSIS OF ADHESIONS, BILATERAL TAP BLOCK;  Surgeon: Andria Meuse, MD;  Location: [facility];  Service: General;  Laterality: N/A;   MOHS SURGERY  2010   nose and shoulder    ROBOTIC ASSITED PARTIAL NEPHRECTOMY Right 10/21/2020   Procedure: RIGHT ROBOTIC PARTIAL NEPHRECTOMY;  Surgeon: Liliane Shi,  Dorian Furnace, MD;  Location: [facility];  Service: Urology;  Laterality: Right;   TONSILLECTOMY AND ADENOIDECTOMY  1969   Family History  Problem Relation Age of Onset   Breast cancer Mother        BRCA +   Heart disease Mother    Stroke Mother    Gout Father    COPD Father    Hyperlipidemia Father    Hypertension Brother    Hyperlipidemia Brother    Hypertension Brother    Allergies as of 08/01/2022   No Known Allergies      Medication List        Accurate as of August 01, 2022 11:15 AM. If you have any questions, ask your nurse or doctor.          STOP taking these medications    cephALEXin 500 MG capsule Commonly known as: KEFLEX Stopped by: Felix Pacini, DO   HYDROcodone-acetaminophen 5-325 MG tablet Commonly known as: NORCO/VICODIN Stopped by: Felix Pacini, DO   ondansetron 8 MG  tablet Commonly known as: ZOFRAN Stopped by: Felix Pacini, DO   tamsulosin 0.4 MG Caps capsule Commonly known as: FLOMAX Stopped by: Felix Pacini, DO       TAKE these medications    cetirizine 10 MG tablet Commonly known as: ZYRTEC Take 10 mg by mouth daily as needed for allergies.   hydrOXYzine 10 MG tablet Commonly known as: ATARAX Take 1-5 tablets (10-50 mg total) by mouth at bedtime.   Ozempic (2 MG/DOSE) 8 MG/3ML Sopn Generic drug: Semaglutide (2 MG/DOSE) Inject into the skin.        All past medical history, surgical history, allergies, family history, immunizations andmedications were updated in the EMR today and reviewed under the history and medication portions of their EMR.     ROS Negative, with the exception of above mentioned in HPI   Objective:  BP 136/74   Pulse 78   Temp 98.2 F (36.8 C)   Wt 189 lb 6.4 oz (85.9 kg)   LMP 08/24/2013   SpO2 98%   BMI 31.52 kg/m  Body mass index is 31.52 kg/m.  Physical Exam Vitals and nursing note reviewed.  Constitutional:      General: She is not in acute distress.    Appearance: Normal appearance. She is normal weight. She is not ill-appearing or toxic-appearing.  HENT:     Head: Normocephalic and atraumatic.  Eyes:     General: No scleral icterus.       Right eye: No discharge.        Left eye: No discharge.     Extraocular Movements: Extraocular movements intact.     Conjunctiva/sclera: Conjunctivae normal.     Pupils: Pupils are equal, round, and reactive to light.  Skin:    General: Skin is dry.     Findings: No rash.     Comments: Chest: mild macular papular rash over chest. Other areas back, thighs arms are dry, no rash present.   Neurological:     Mental Status: She is alert and oriented to person, place, and time. Mental status is at baseline.     Motor: No weakness.     Coordination: Coordination normal.     Gait: Gait normal.  Psychiatric:        Mood and Affect: Mood normal.         Behavior: Behavior normal.        Thought Content: Thought content normal.  Judgment: Judgment normal.      No results found. No results found. No results found for this or any previous visit (from the past 24 hour(s)).  Assessment/Plan: [name] is a 62 y.o. female present for OV for  Pruritus Suspect dryness of skin causing the itchiness. Continue Vistaril 50 mg at bedtime as needed. Encouraged her to avoid extreme hot water when showering and switch to [facility] moisturizing soap We discussed repeating CMP and TSH today and those were both normal in January and she had them repeated again in February at her specialty team.  The decision was made to wait on repeating labs. Discussed skin allergies, and she decided to wait for now for referral. Follow-up as needed   Reviewed expectations re: course of current medical issues. Discussed self-management of symptoms. Outlined signs and symptoms indicating need for more acute intervention. Patient verbalized understanding and all questions were answered. Patient received an After-Visit Summary.    No orders of the defined types were placed in this encounter.  No orders of the defined types were placed in this encounter.  Referral Orders  No referral(s) requested today     Note is dictated utilizing voice recognition software. Although note has been proof read prior to signing, occasional typographical errors still can be missed. If any questions arise, please do not hesitate to call for verification.   electronically signed by:  Felix Pacini, DO  [facility] - OR

## 2022-08-01 NOTE — Patient Instructions (Signed)
No follow-ups on file.  Try [employer] daily. Refilled vistaril for you.       Great to see you today.  I have refilled the medication(s) we provide.   If labs were collected, we will inform you of lab results once received either by echart message or telephone call.   - echart message- for normal results that have been seen by the patient already.   - telephone call: abnormal results or if patient has not viewed results in their echart.

## 2023-02-19 ENCOUNTER — Encounter: Payer: Commercial Managed Care - PPO | Admitting: Family Medicine

## 2023-04-01 ENCOUNTER — Ambulatory Visit (HOSPITAL_COMMUNITY)
Admission: RE | Admit: 2023-04-01 | Discharge: 2023-04-01 | Disposition: A | Discharge status: Home / Self Care | Payer: Commercial Managed Care - PPO | Source: Ambulatory Visit | Attending: Urology | Admitting: Urology

## 2023-04-01 ENCOUNTER — Other Ambulatory Visit (HOSPITAL_COMMUNITY): Payer: Self-pay | Admitting: Urology

## 2023-04-01 DIAGNOSIS — C641 Malignant neoplasm of right kidney, except renal pelvis: Secondary | ICD-10-CM

## 2023-04-05 ENCOUNTER — Other Ambulatory Visit (HOSPITAL_BASED_OUTPATIENT_CLINIC_OR_DEPARTMENT_OTHER): Payer: Self-pay | Admitting: Family Medicine

## 2023-04-05 DIAGNOSIS — Z1231 Encounter for screening mammogram for malignant neoplasm of breast: Secondary | ICD-10-CM

## 2023-04-08 ENCOUNTER — Encounter (HOSPITAL_BASED_OUTPATIENT_CLINIC_OR_DEPARTMENT_OTHER): Payer: Self-pay | Admitting: Radiology

## 2023-04-08 ENCOUNTER — Ambulatory Visit (HOSPITAL_BASED_OUTPATIENT_CLINIC_OR_DEPARTMENT_OTHER)
Admission: RE | Admit: 2023-04-08 | Discharge: 2023-04-08 | Disposition: A | Discharge status: Home / Self Care | Payer: Commercial Managed Care - PPO | Source: Ambulatory Visit | Attending: Family Medicine | Admitting: Family Medicine

## 2023-04-08 DIAGNOSIS — Z1231 Encounter for screening mammogram for malignant neoplasm of breast: Secondary | ICD-10-CM | POA: Diagnosis present

## 2023-04-12 ENCOUNTER — Other Ambulatory Visit: Payer: Self-pay | Admitting: Urology

## 2023-04-12 DIAGNOSIS — K862 Cyst of pancreas: Secondary | ICD-10-CM

## 2023-04-22 IMAGING — MG MM BREAST LOCALIZATION CLIP
2 series · 3 of 6 positions shown · non-contrast
Comparison: Previous exam(s).

CLINICAL DATA: Post procedure mammogram for clip placement.

EXAM:
3D DIAGNOSTIC RIGHT MAMMOGRAM POST ULTRASOUND BIOPSY

[R MLO synth-2D]
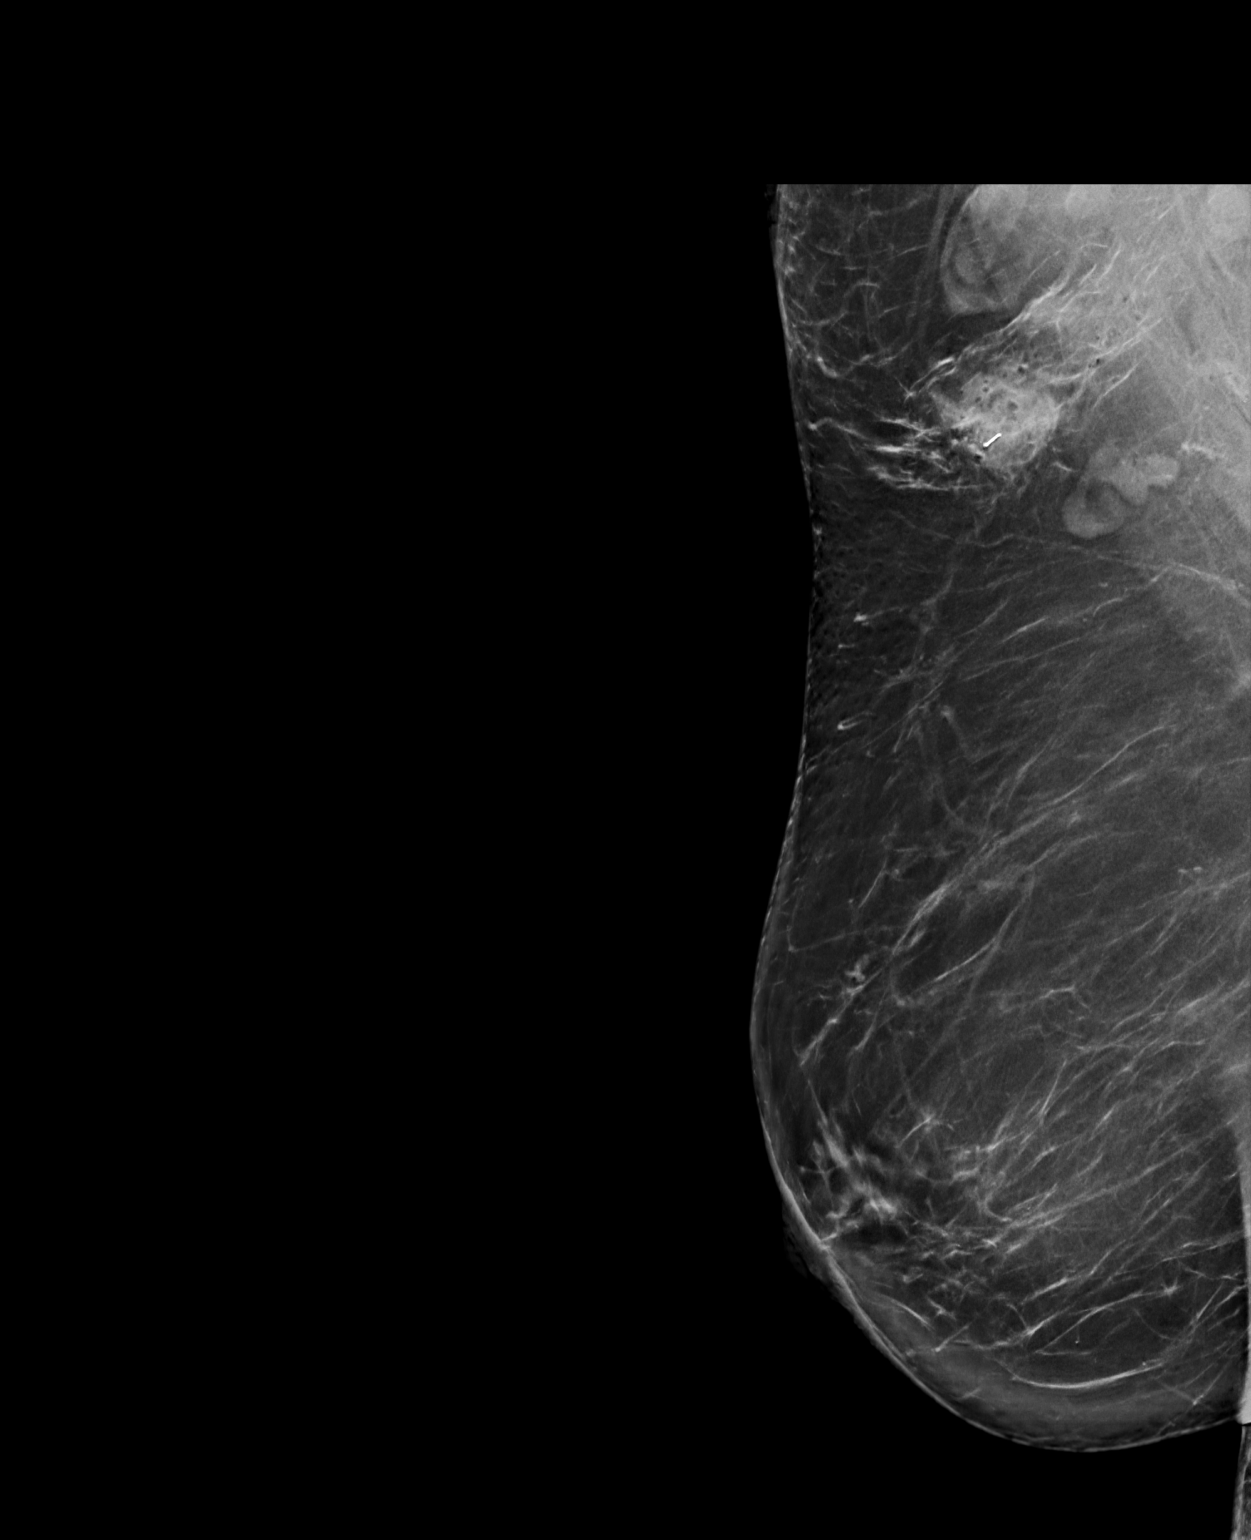

[R MLO tomo · 2 of 99 frames shown]
[frame 32/99]
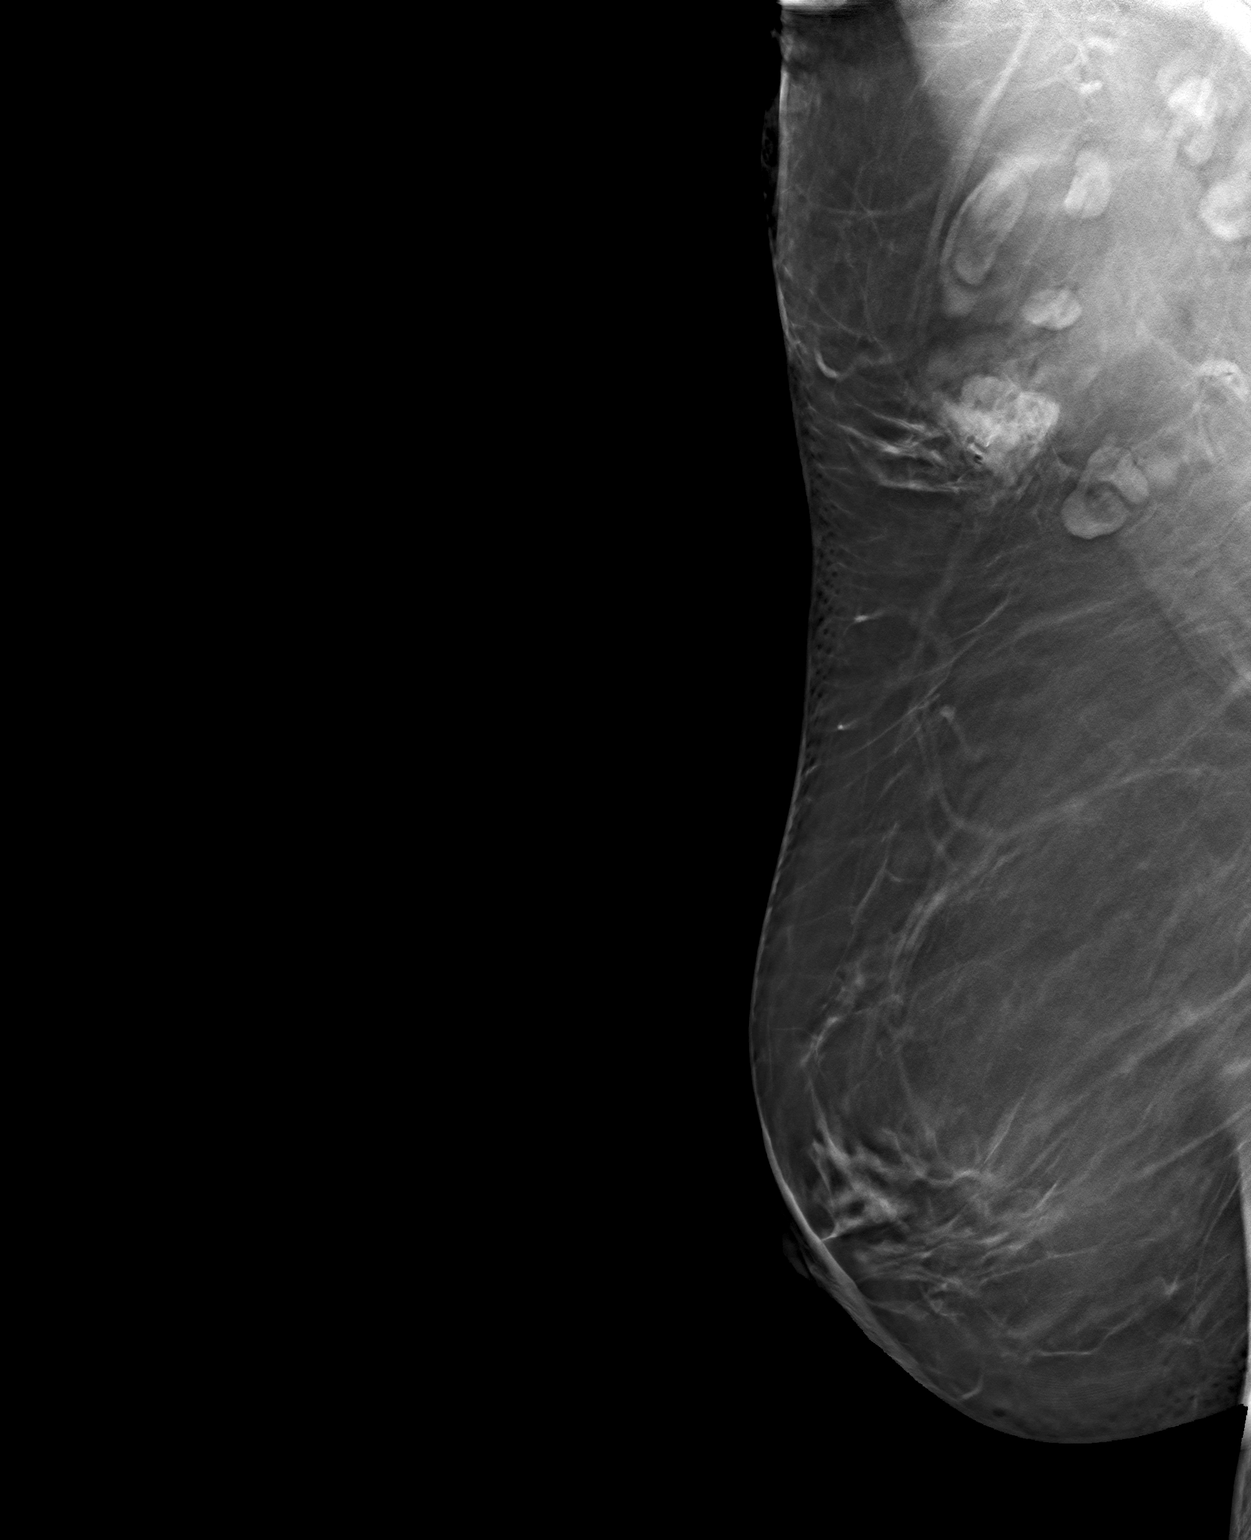
[frame 50/99]
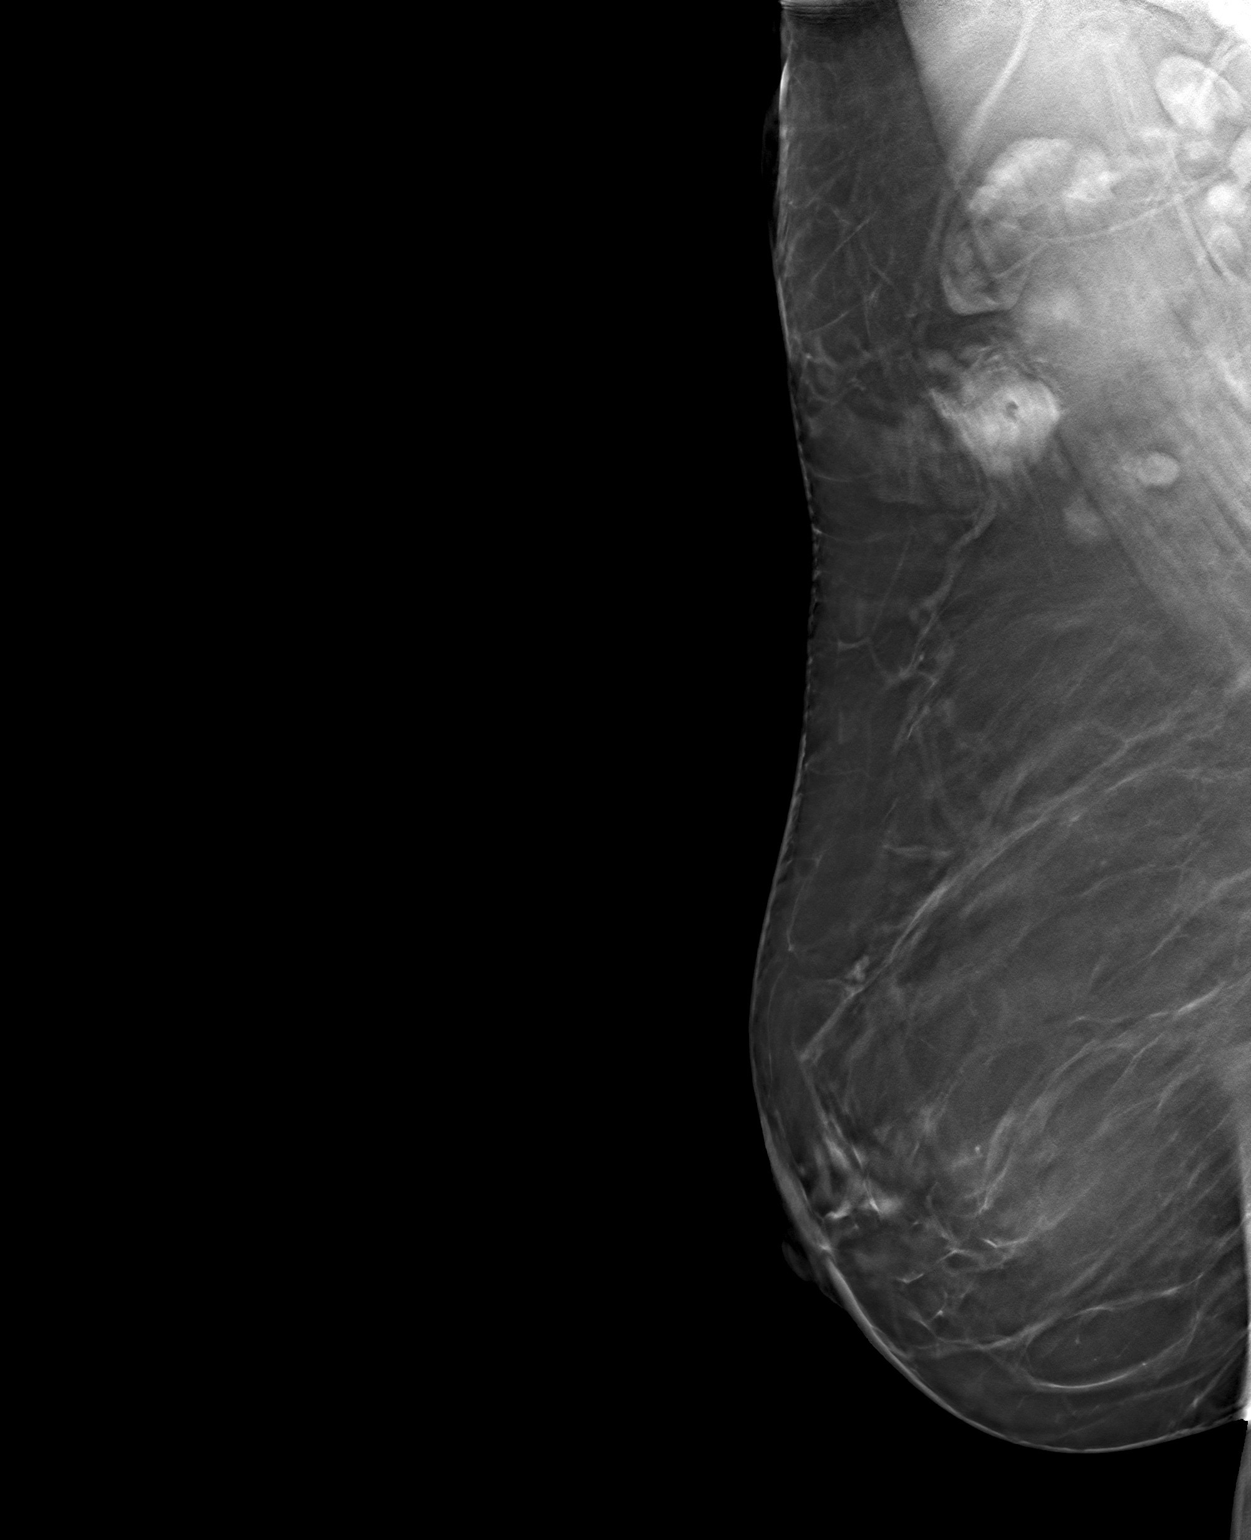

[3 of 6 positions shown; findings below may reference images not displayed]

FINDINGS: 3D Mammographic images were obtained following ultrasound guided
biopsy of a right axillary lymph node. The biopsy marking clip is in
expected position at the site of biopsy.
IMPRESSION: Appropriate positioning of the tribell shaped biopsy marking clip at
the site of biopsy in the right axilla.

Final Assessment: Post Procedure Mammograms for Marker Placement

## 2023-05-09 ENCOUNTER — Ambulatory Visit
Admission: RE | Admit: 2023-05-09 | Discharge: 2023-05-09 | Disposition: A | Discharge status: Home / Self Care | Source: Ambulatory Visit | Attending: Urology

## 2023-05-09 DIAGNOSIS — K862 Cyst of pancreas: Secondary | ICD-10-CM

## 2023-05-09 MED ORDER — GADOPICLENOL 0.5 MMOL/ML IV SOLN
9.0000 mL | Freq: Once | INTRAVENOUS | Status: AC | PRN
  Administered 2023-05-09: 9 mL via INTRAVENOUS

## 2023-07-05 IMAGING — DX DG CHEST 2V
2 series · 2 of 2 positions shown · non-contrast
Comparison: 08/17/2020

CLINICAL DATA: 60-year-old female with renal carcinoma

EXAM:
CHEST - 2 VIEW

[chest pa]
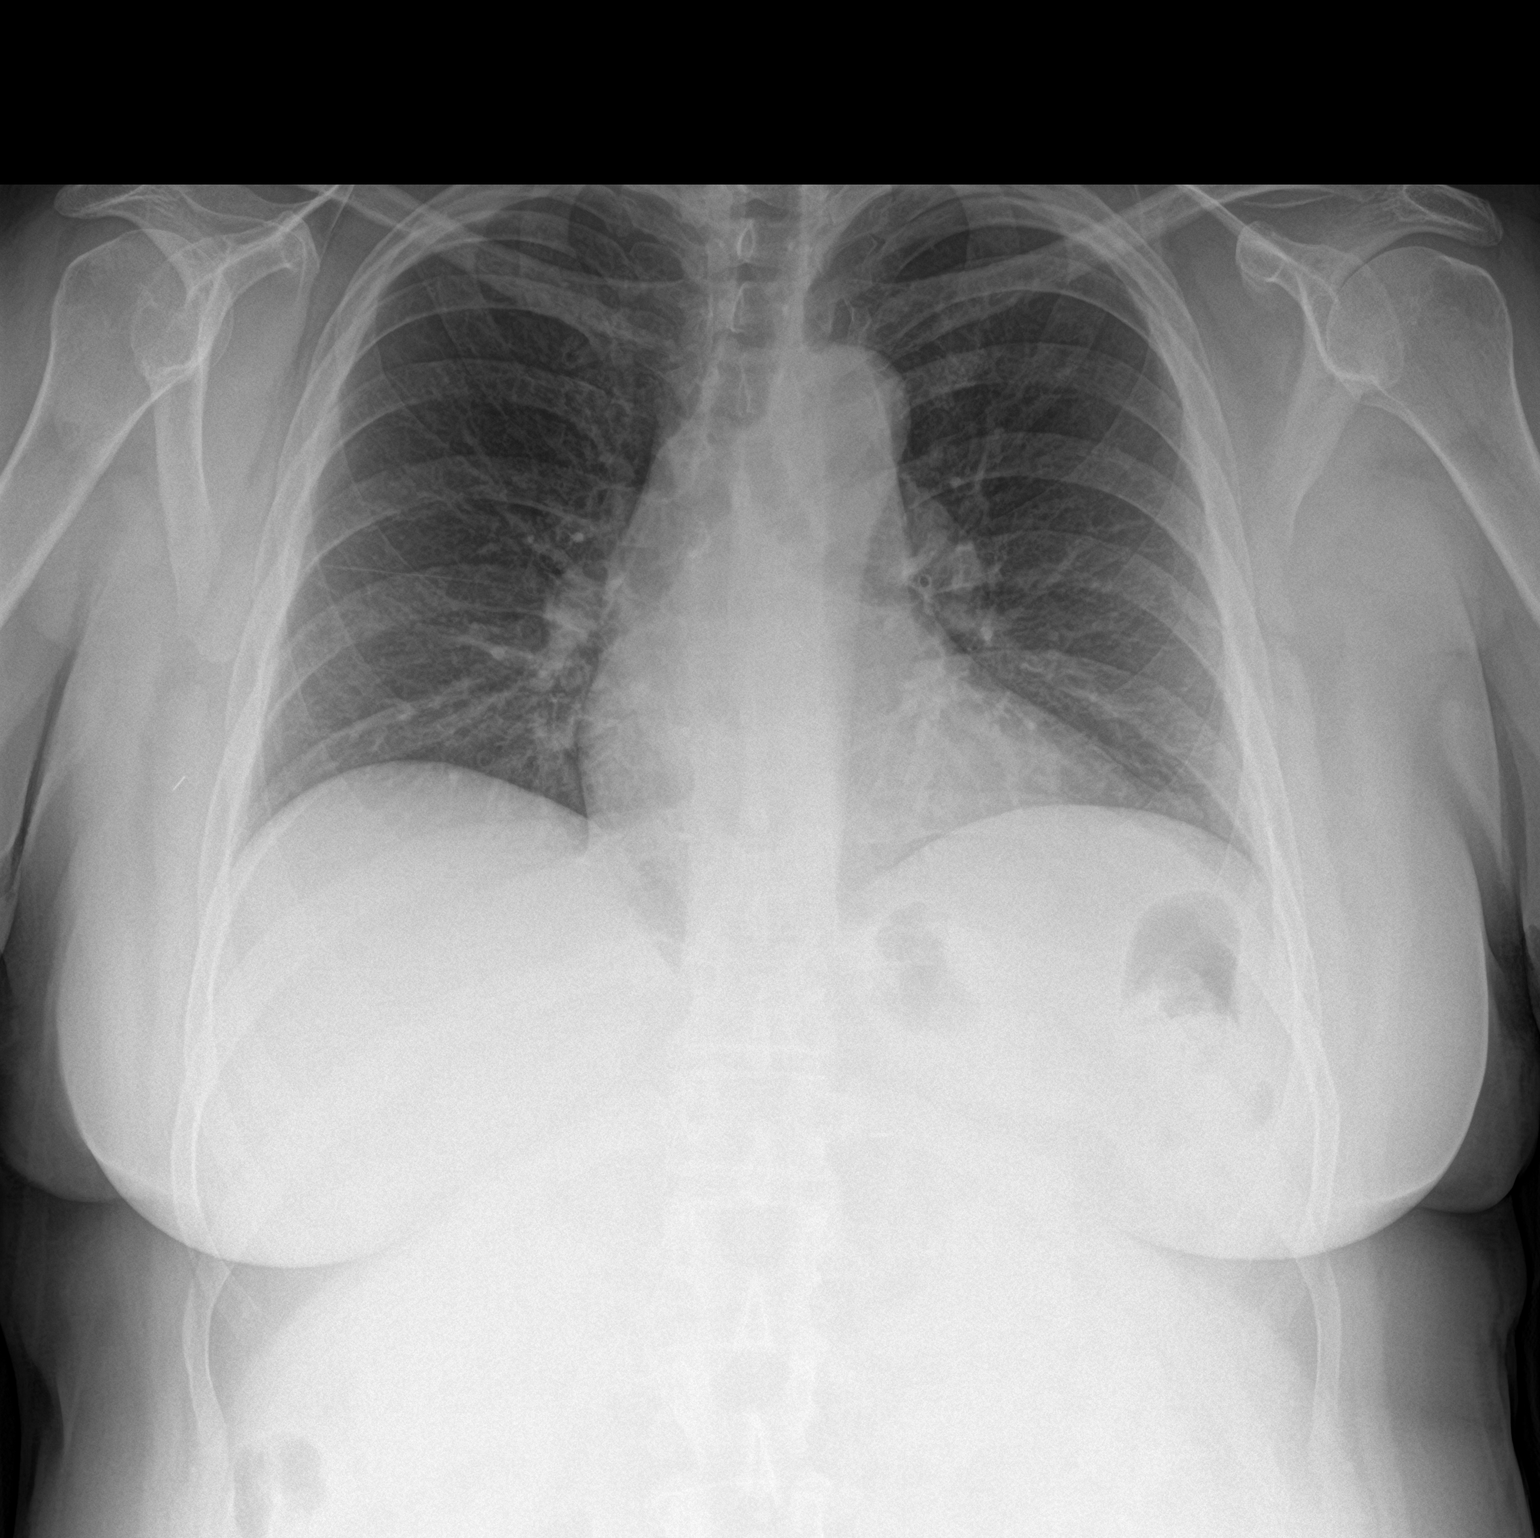

[chest lat]
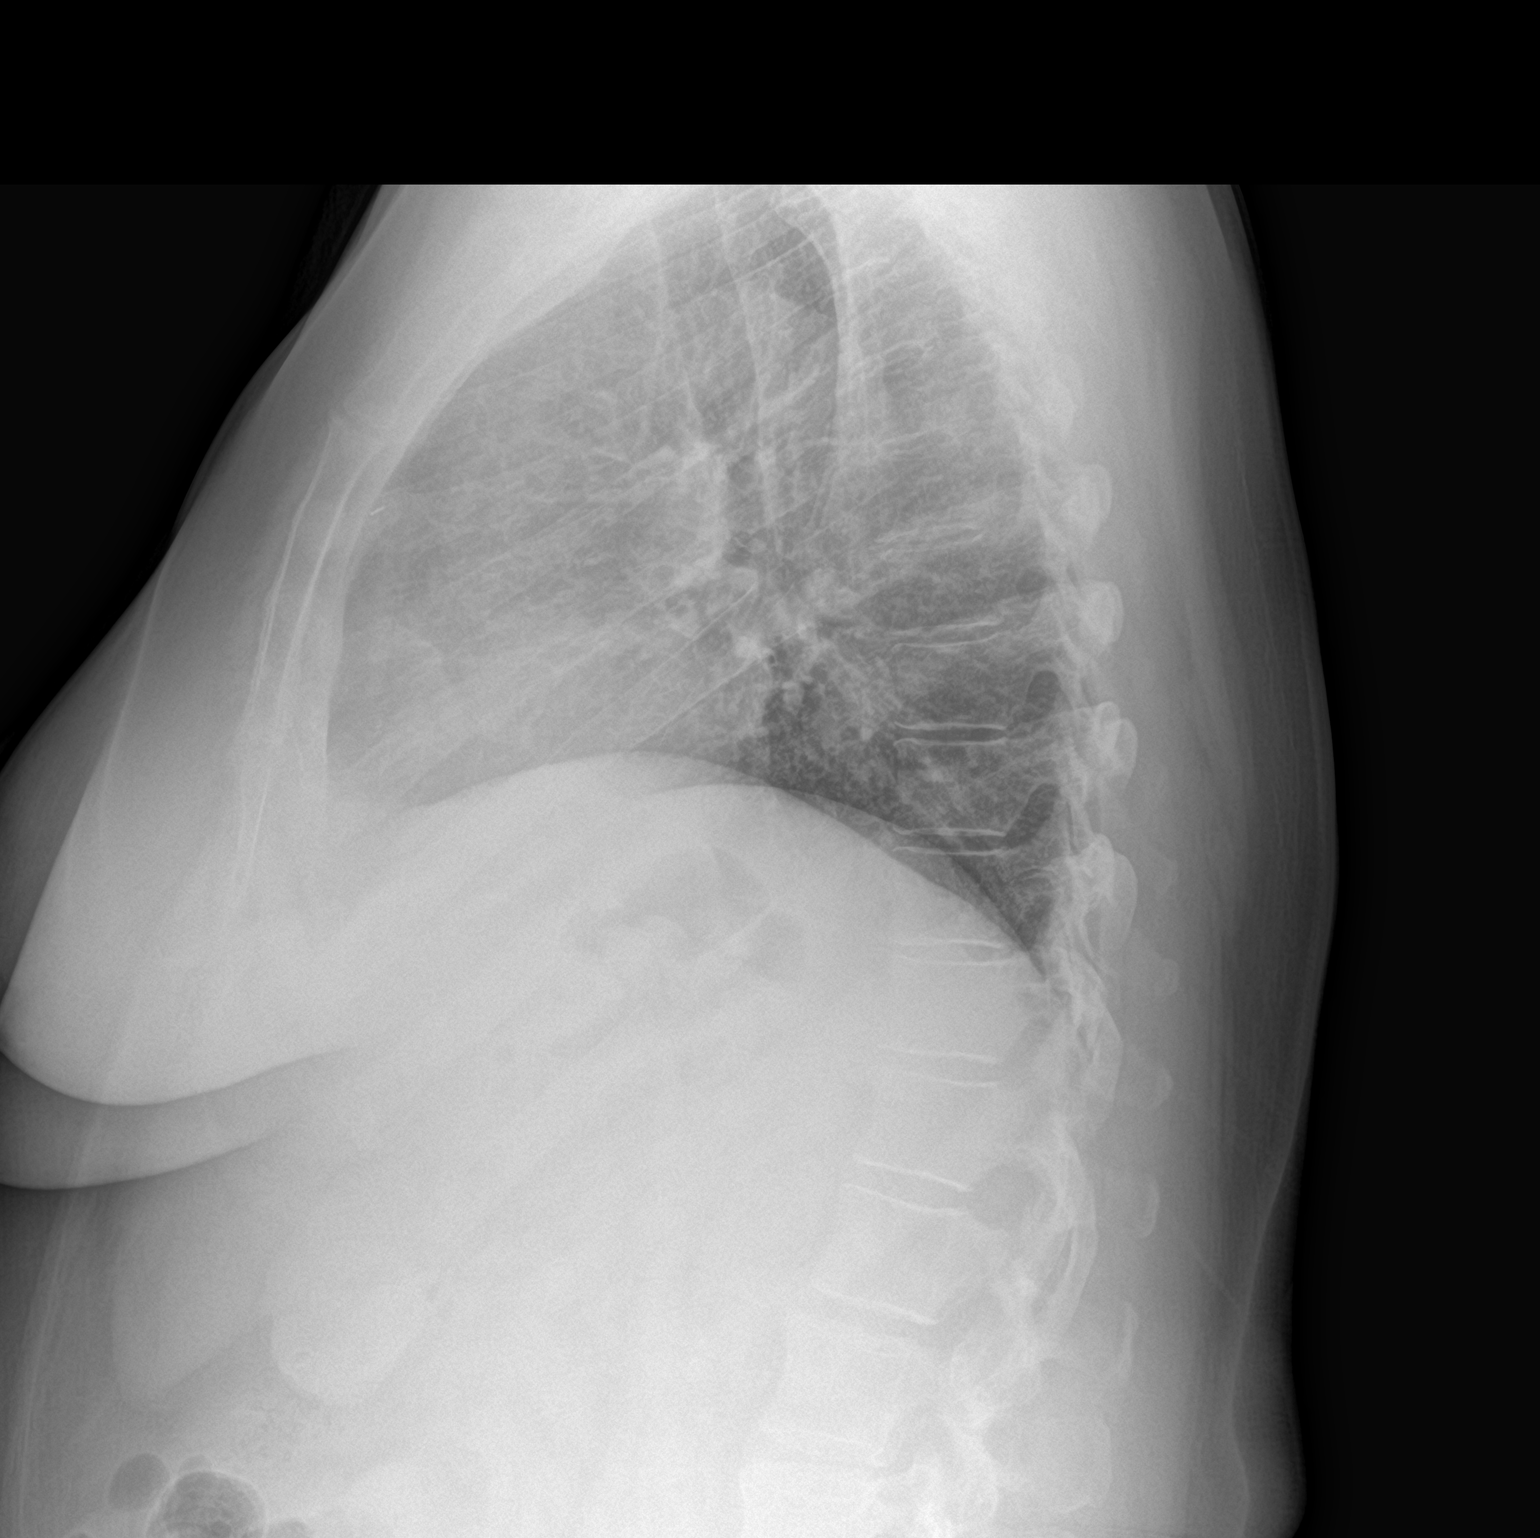

[2 of 2 positions shown; findings below may reference images not displayed]

FINDINGS: Cardiomediastinal silhouette unchanged in size and contour. No
evidence of central vascular congestion. No interlobular septal
thickening.

No pneumothorax or pleural effusion. Coarsened interstitial
markings, with no confluent airspace disease.

No acute displaced fracture. Degenerative changes of the spine.
IMPRESSION: No active cardiopulmonary disease.

## 2024-04-08 ENCOUNTER — Encounter

## 2024-04-08 DIAGNOSIS — Z1231 Encounter for screening mammogram for malignant neoplasm of breast: Secondary | ICD-10-CM
